# Patient Record
Sex: Female | Born: 1935 | Race: Black or African American | Hispanic: No | Smoking: Never smoker
Health system: Southern US, Community
[De-identification: ages and names within clinical notes are randomized; demographics above are authoritative.]

## PROBLEM LIST (undated history)

## (undated) DIAGNOSIS — F0390 Unspecified dementia without behavioral disturbance: Secondary | ICD-10-CM

## (undated) DIAGNOSIS — F0280 Dementia in other diseases classified elsewhere without behavioral disturbance: Secondary | ICD-10-CM

## (undated) DIAGNOSIS — G309 Alzheimer's disease, unspecified: Secondary | ICD-10-CM

## (undated) DIAGNOSIS — I10 Essential (primary) hypertension: Secondary | ICD-10-CM

## (undated) DIAGNOSIS — N289 Disorder of kidney and ureter, unspecified: Secondary | ICD-10-CM

---

## 1999-08-29 ENCOUNTER — Emergency Department (HOSPITAL_COMMUNITY): Admission: EM | Admit: 1999-08-29 | Discharge: 1999-08-29 | Payer: Self-pay | Admitting: Emergency Medicine

## 2000-12-25 ENCOUNTER — Other Ambulatory Visit: Admission: RE | Admit: 2000-12-25 | Discharge: 2000-12-25 | Payer: Self-pay | Admitting: Family Medicine

## 2000-12-28 ENCOUNTER — Encounter: Admission: RE | Admit: 2000-12-28 | Discharge: 2000-12-28 | Payer: Self-pay | Admitting: Family Medicine

## 2000-12-28 ENCOUNTER — Encounter: Payer: Self-pay | Admitting: Family Medicine

## 2001-03-23 ENCOUNTER — Encounter: Payer: Self-pay | Admitting: Obstetrics and Gynecology

## 2001-03-28 ENCOUNTER — Encounter (INDEPENDENT_AMBULATORY_CARE_PROVIDER_SITE_OTHER): Payer: Self-pay

## 2001-03-28 ENCOUNTER — Ambulatory Visit (HOSPITAL_COMMUNITY): Admission: RE | Admit: 2001-03-28 | Discharge: 2001-03-28 | Payer: Self-pay | Admitting: Obstetrics and Gynecology

## 2002-12-13 ENCOUNTER — Encounter: Admission: RE | Admit: 2002-12-13 | Discharge: 2002-12-13 | Payer: Self-pay | Admitting: Family Medicine

## 2002-12-13 ENCOUNTER — Encounter: Payer: Self-pay | Admitting: Family Medicine

## 2003-07-31 ENCOUNTER — Other Ambulatory Visit: Admission: RE | Admit: 2003-07-31 | Discharge: 2003-07-31 | Payer: Self-pay | Admitting: Obstetrics and Gynecology

## 2004-02-11 ENCOUNTER — Ambulatory Visit (HOSPITAL_COMMUNITY): Admission: RE | Admit: 2004-02-11 | Discharge: 2004-02-11 | Payer: Self-pay | Admitting: *Deleted

## 2004-05-25 ENCOUNTER — Encounter: Admission: RE | Admit: 2004-05-25 | Discharge: 2004-05-25 | Payer: Self-pay | Admitting: Family Medicine

## 2004-08-06 ENCOUNTER — Other Ambulatory Visit: Admission: RE | Admit: 2004-08-06 | Discharge: 2004-08-06 | Payer: Self-pay | Admitting: Obstetrics and Gynecology

## 2005-07-22 ENCOUNTER — Encounter: Admission: RE | Admit: 2005-07-22 | Discharge: 2005-07-22 | Payer: Self-pay | Admitting: Family Medicine

## 2005-08-11 ENCOUNTER — Encounter: Admission: RE | Admit: 2005-08-11 | Discharge: 2005-08-11 | Payer: Self-pay | Admitting: Nephrology

## 2006-01-17 ENCOUNTER — Ambulatory Visit: Payer: Self-pay

## 2006-05-25 ENCOUNTER — Inpatient Hospital Stay (HOSPITAL_COMMUNITY): Admission: RE | Admit: 2006-05-25 | Discharge: 2006-05-30 | Payer: Self-pay | Admitting: Orthopaedic Surgery

## 2006-08-03 ENCOUNTER — Encounter (HOSPITAL_COMMUNITY): Admission: RE | Admit: 2006-08-03 | Discharge: 2006-09-01 | Payer: Self-pay | Admitting: Nephrology

## 2006-09-08 ENCOUNTER — Encounter (HOSPITAL_COMMUNITY): Admission: RE | Admit: 2006-09-08 | Discharge: 2006-12-07 | Payer: Self-pay | Admitting: Nephrology

## 2007-02-03 ENCOUNTER — Emergency Department (HOSPITAL_COMMUNITY): Admission: EM | Admit: 2007-02-03 | Discharge: 2007-02-04 | Payer: Self-pay | Admitting: Emergency Medicine

## 2007-02-11 ENCOUNTER — Ambulatory Visit: Payer: Self-pay | Admitting: Critical Care Medicine

## 2007-02-11 ENCOUNTER — Inpatient Hospital Stay (HOSPITAL_COMMUNITY): Admission: EM | Admit: 2007-02-11 | Discharge: 2007-02-20 | Payer: Self-pay | Admitting: *Deleted

## 2007-02-13 ENCOUNTER — Encounter (INDEPENDENT_AMBULATORY_CARE_PROVIDER_SITE_OTHER): Payer: Self-pay | Admitting: Cardiology

## 2007-02-14 ENCOUNTER — Ambulatory Visit: Payer: Self-pay | Admitting: Vascular Surgery

## 2007-02-14 ENCOUNTER — Encounter (INDEPENDENT_AMBULATORY_CARE_PROVIDER_SITE_OTHER): Payer: Self-pay | Admitting: Specialist

## 2007-03-27 ENCOUNTER — Encounter: Admission: RE | Admit: 2007-03-27 | Discharge: 2007-03-27 | Payer: Self-pay | Admitting: Family Medicine

## 2007-04-03 ENCOUNTER — Ambulatory Visit (HOSPITAL_COMMUNITY): Admission: RE | Admit: 2007-04-03 | Discharge: 2007-04-03 | Payer: Self-pay | Admitting: Nephrology

## 2007-04-09 ENCOUNTER — Encounter: Admission: RE | Admit: 2007-04-09 | Discharge: 2007-04-09 | Payer: Self-pay | Admitting: Family Medicine

## 2007-05-01 ENCOUNTER — Ambulatory Visit (HOSPITAL_COMMUNITY): Admission: RE | Admit: 2007-05-01 | Discharge: 2007-05-01 | Payer: Self-pay | Admitting: Nephrology

## 2007-05-15 ENCOUNTER — Encounter (HOSPITAL_COMMUNITY): Admission: RE | Admit: 2007-05-15 | Discharge: 2007-08-13 | Payer: Self-pay | Admitting: Nephrology

## 2007-05-25 ENCOUNTER — Encounter: Admission: RE | Admit: 2007-05-25 | Discharge: 2007-05-25 | Payer: Self-pay | Admitting: Family Medicine

## 2007-06-27 ENCOUNTER — Encounter (INDEPENDENT_AMBULATORY_CARE_PROVIDER_SITE_OTHER): Payer: Self-pay | Admitting: Cardiology

## 2007-06-27 ENCOUNTER — Ambulatory Visit (HOSPITAL_COMMUNITY): Admission: RE | Admit: 2007-06-27 | Discharge: 2007-06-27 | Payer: Self-pay | Admitting: Cardiology

## 2007-06-27 ENCOUNTER — Ambulatory Visit: Payer: Self-pay | Admitting: Vascular Surgery

## 2007-08-21 ENCOUNTER — Encounter (HOSPITAL_COMMUNITY): Admission: RE | Admit: 2007-08-21 | Discharge: 2007-11-19 | Payer: Self-pay | Admitting: Nephrology

## 2007-12-11 ENCOUNTER — Encounter (HOSPITAL_COMMUNITY): Admission: RE | Admit: 2007-12-11 | Discharge: 2008-03-10 | Payer: Self-pay | Admitting: Nephrology

## 2008-07-04 ENCOUNTER — Encounter (HOSPITAL_COMMUNITY): Admission: RE | Admit: 2008-07-04 | Discharge: 2008-08-20 | Payer: Self-pay | Admitting: Nephrology

## 2009-07-31 ENCOUNTER — Inpatient Hospital Stay (HOSPITAL_COMMUNITY): Admission: EM | Admit: 2009-07-31 | Discharge: 2009-08-03 | Payer: Self-pay | Admitting: Emergency Medicine

## 2011-03-26 LAB — BASIC METABOLIC PANEL
CO2: 28 mEq/L (ref 19–32)
Chloride: 105 mEq/L (ref 96–112)
Creatinine, Ser: 1.69 mg/dL — ABNORMAL HIGH (ref 0.4–1.2)
GFR calc Af Amer: 35 mL/min — ABNORMAL LOW (ref >60)
Glucose, Bld: 109 mg/dL — ABNORMAL HIGH (ref 70–99)
Potassium: 4.2 mEq/L (ref 3.5–5.1)

## 2011-03-26 LAB — GLUCOSE, CAPILLARY: Glucose-Capillary: 113 mg/dL — ABNORMAL HIGH (ref 70–99)

## 2011-03-27 LAB — COMPREHENSIVE METABOLIC PANEL
ALT: 12 U/L (ref 0–35)
ALT: 16 U/L (ref 0–35)
AST: 30 U/L (ref 0–37)
AST: 34 U/L (ref 0–37)
Albumin: 2.9 g/dL — ABNORMAL LOW (ref 3.5–5.2)
Albumin: 3.2 g/dL — ABNORMAL LOW (ref 3.5–5.2)
Alkaline Phosphatase: 51 U/L (ref 39–117)
Alkaline Phosphatase: 52 U/L (ref 39–117)
CO2: 30 mEq/L (ref 19–32)
Chloride: 103 mEq/L (ref 96–112)
Creatinine, Ser: 1.8 mg/dL — ABNORMAL HIGH (ref 0.4–1.2)
GFR calc Af Amer: 29 mL/min — ABNORMAL LOW (ref >60)
GFR calc Af Amer: 32 mL/min — ABNORMAL LOW (ref >60)
GFR calc non Af Amer: 27 mL/min — ABNORMAL LOW (ref >60)
Glucose, Bld: 147 mg/dL — ABNORMAL HIGH (ref 70–99)
Potassium: 3 mEq/L — ABNORMAL LOW (ref 3.5–5.1)
Potassium: 3.1 mEq/L — ABNORMAL LOW (ref 3.5–5.1)
Sodium: 137 mEq/L (ref 135–145)
Sodium: 142 mEq/L (ref 135–145)
Total Bilirubin: 0.9 mg/dL (ref 0.3–1.2)
Total Protein: 5.6 g/dL — ABNORMAL LOW (ref 6.0–8.3)

## 2011-03-27 LAB — CBC
HCT: 20.2 % — ABNORMAL LOW (ref 36.0–46.0)
HCT: 22 % — ABNORMAL LOW (ref 36.0–46.0)
Hemoglobin: 6.7 g/dL — CL (ref 12.0–15.0)
Hemoglobin: 7.6 g/dL — CL (ref 12.0–15.0)
Hemoglobin: 9.1 g/dL — ABNORMAL LOW (ref 12.0–15.0)
MCHC: 33.2 g/dL (ref 30.0–36.0)
MCHC: 34.7 g/dL (ref 30.0–36.0)
MCV: 88.2 fL (ref 78.0–100.0)
MCV: 92.4 fL (ref 78.0–100.0)
Platelets: 119 10*3/uL — ABNORMAL LOW (ref 150–400)
Platelets: 141 10*3/uL — ABNORMAL LOW (ref 150–400)
RBC: 2.57 MIL/uL — ABNORMAL LOW (ref 3.87–5.11)
RBC: 2.85 MIL/uL — ABNORMAL LOW (ref 3.87–5.11)
RDW: 13.9 % (ref 11.5–15.5)
RDW: 14.5 % (ref 11.5–15.5)
WBC: 10.4 10*3/uL (ref 4.0–10.5)
WBC: 9.3 10*3/uL (ref 4.0–10.5)

## 2011-03-27 LAB — PREPARE FRESH FROZEN PLASMA

## 2011-03-27 LAB — DIFFERENTIAL
Basophils Absolute: 0 10*3/uL (ref 0.0–0.1)
Eosinophils Absolute: 0 10*3/uL (ref 0.0–0.7)
Eosinophils Relative: 0 % (ref 0–5)
Monocytes Absolute: 0.6 10*3/uL (ref 0.1–1.0)

## 2011-03-27 LAB — BASIC METABOLIC PANEL
BUN: 43 mg/dL — ABNORMAL HIGH (ref 6–23)
Chloride: 106 mEq/L (ref 96–112)
GFR calc non Af Amer: 27 mL/min — ABNORMAL LOW (ref >60)
Glucose, Bld: 111 mg/dL — ABNORMAL HIGH (ref 70–99)
Potassium: 4.4 mEq/L (ref 3.5–5.1)
Sodium: 140 mEq/L (ref 135–145)

## 2011-03-27 LAB — URINALYSIS, ROUTINE W REFLEX MICROSCOPIC
Glucose, UA: NEGATIVE mg/dL
Ketones, ur: NEGATIVE mg/dL
Leukocytes, UA: NEGATIVE
Nitrite: NEGATIVE
Protein, ur: NEGATIVE mg/dL
Specific Gravity, Urine: 1.009 (ref 1.005–1.030)
Specific Gravity, Urine: 1.012 (ref 1.005–1.030)
Urobilinogen, UA: 0.2 mg/dL (ref 0.0–1.0)
pH: 6 (ref 5.0–8.0)

## 2011-03-27 LAB — CROSSMATCH: ABO/RH(D): O POS

## 2011-03-27 LAB — APTT
aPTT: 29 seconds (ref 24–37)
aPTT: 35 seconds (ref 24–37)

## 2011-03-27 LAB — PROTIME-INR
INR: 1.5 (ref 0.00–1.49)
INR: 1.7 — ABNORMAL HIGH (ref 0.00–1.49)
Prothrombin Time: 19.4 seconds — ABNORMAL HIGH (ref 11.6–15.2)
Prothrombin Time: >90 seconds — ABNORMAL HIGH (ref 11.6–15.2)

## 2011-03-27 LAB — POCT CARDIAC MARKERS
CKMB, poc: <1 ng/mL — ABNORMAL LOW (ref 1.0–8.0)
Troponin i, poc: <0.05 ng/mL (ref 0.00–0.09)

## 2011-03-27 LAB — GLUCOSE, CAPILLARY: Glucose-Capillary: 115 mg/dL — ABNORMAL HIGH (ref 70–99)

## 2011-03-27 LAB — HEMOGLOBIN AND HEMATOCRIT, BLOOD: Hemoglobin: 9.5 g/dL — ABNORMAL LOW (ref 12.0–15.0)

## 2011-05-03 NOTE — Discharge Summary (Signed)
[name], [name]                 ACCOUNT NO.:  [id number]   MEDICAL RECORD NO.:  [id number]          PATIENT TYPE:  INP   LOCATION:  1423                         FACILITY:  [facility]   PHYSICIAN:  Corinna L. Lendell Caprice, MDDATE OF BIRTH:  [birth year]   DATE OF ADMISSION:  07/30/2009  DATE OF DISCHARGE:  08/03/2009                               DISCHARGE SUMMARY   DISCHARGE DIAGNOSES:  1. Coumadin poisoning  2. Mouth bleeding and possible gastrointestinal bleeding secondary to      Coumadin poisoning, resolved.  3. History of pulmonary embolus in 2008.  4. Hypertension.  5. Constipation.  6. Renal insufficiency.  7. Acute blood loss anemia.  8. Hypokalemia.   DISCHARGE MEDICATIONS:  Stop Coumadin (The patient was not supposed to  be on this, and we have thrown away her bottles.)  No aspirin, ibuprofen  or Naprosyn for 2 weeks.  Prilosec 20 mg daily for 2 weeks (empiric).  K-  Dur 10 mEq daily, Lasix 40 mg a day, amlodipine 10 mg a day, metoprolol  12.5 twice a day, laxative of choice as needed.   CONDITION:  Stable.   DIET:  Low salt.   FOLLOW-UP:  With Dr. Arvilla Market in a week.   ACTIVITY:  Continue walker.   CONSULTATIONS:  None.   PROCEDURES:  None.   LABS:  Initial hemoglobin was 3.5, which was repeated and confirmed. At  discharge her hemoglobin remained stable at 9.8, hematocrit on admission  11.  At discharge he had hematocrit 28, MCV 88.  Initial white blood  cell count was 12.8, but this normalized the same day. Platelet count on  admission 141. INR greater than 9.8.  Pro time greater than 90, PTT 86.  On August 15 her INR was 1.3.  Pro time 16, PTT 29.  Sodium on admission  137, potassium 3, chloride 100, bicarbonate 23, glucose 147, BUN 100,  creatinine 1.98.  Liver function tests significant for an albumin of  2.9. At discharge her potassium was 4.2, BUN 35, glucose 109, creatinine  1.69. Point of care enzymes negative. On admission her urinalysis was  contaminated  with many squamous epithelial cells.  Repeat urinalysis  showed trace blood, negative nitrite, negative leukocyte esterase.   SPECIAL STUDIES:  Radiology: Chest x-ray on admission showed nothing  acute.  No change in chronic elevation of left hemidiaphragm.  EKG  showed normal sinus rhythm with first-degree AV block, left ventricular  hypertrophy and repolarization abnormality.   HISTORY AND HOSPITAL COURSE:  [name] is a pleasant 77 year old white  female who presented with bleeding from the mouth, tarry stools and  bright red bloody stools.  She was found to have a hemoglobin of 3.1.  An H and P reports a blood pressure of 148/68, heart rate 83.  She had  dried blood in her mouth.  The patient was given transfusion of packed  red blood cells, FFP. She was also given vitamin K.  She denied being on  Coumadin, but there were 3 or 4 pill bottles that were old and had  Coumadin.  I suspect she  was inadvertently taking these.  After reversal  of her coagulopathy, she had no further bleeding.  Her blood pressure  remained stable.  She had no further melanotic stools.  In fact, she has  been constipated here.  Initially, there was concern about urinary tract  infection, but this sample was contaminated and upon repeat was  negative.  The patient is tolerating a regular diet.  She is ambulating  with a walker.  Her hemoglobins have been stable. Her renal  insufficiency has improved, and she will get a Dulcolax suppository and  then go home later on this afternoon. Total time on the day of discharge  is 50 minutes.      Corinna L. Lendell Caprice, MD  Electronically Signed     CLS/MEDQ  D:  08/03/2009  T:  08/03/2009  Job:  [id number]   cc:   Donia Guiles, M.D.  Fax: [phone]

## 2011-05-03 NOTE — H&P (Signed)
[name], [name]                 ACCOUNT NO.:  [id number]   MEDICAL RECORD NO.:  [id number]          PATIENT TYPE:  EMS   LOCATION:  ED                           FACILITY:  [facility]   PHYSICIAN:  Beckey Rutter, MD  DATE OF BIRTH:  08/14/1919   DATE OF ADMISSION:  07/30/2009  DATE OF DISCHARGE:                              HISTORY & PHYSICAL   PRIMARY CARE PHYSICIAN:  Dr. Donia Guiles.   CHIEF COMPLAINT:  Rectal bleeding.   HISTORY OF PRESENT ILLNESS:  [name] is an 77 year old pleasant  African American female with past medical history significant for  pulmonary embolism, pneumonia, history of congestive heart failure with  diastolic dysfunction, chronic renal insufficiency, hypertension,  hyperlipidemia and degenerative joint disease who presented today by  ambulance to [facility] ER because of rectal bleeding.  The patient was  in her usual state of health when she noticed mouth bleeding.  The  patient was not coughing or nauseated, but nevertheless she continued to  spit significant amount of blood.  EMS was called and upon home  evaluation she was told that she had dental bleeding.  The patient then  stayed at home and she was noticed to have rectal bleeding with dark  tarry stool and bright red blood.  The EMS was called again and at this  time the patient brought to the hospital.  In the emergency department,  the patient was found with profound anemia as noted below.  The patient  has mild nausea, but she denied any significant vomiting.  She denied  abdominal pain, headache, fever, chills, severe lightheadedness or  syncope.  The patient does not have history of liver cirrhosis and she  is not taking blood thinner.  Although Coumadin was enlisted in her  medication, the family and the patient stated that she was taking  Coumadin previously because of the pulmonary embolism and the knee  surgery, but currently she is not taking Coumadin.   PAST MEDICAL  HISTORY:  1. Mild congestive heart failure with diastolic dysfunction.  2. Chronic renal insufficiency.  3. Hypertension.  4. Hyperlipidemia.  5. Degenerative joint disease.  6. History of pneumonia.  7. Acute pulmonary embolism in March 2008, with infarct and bloody      pleural effusion.   MEDICATION ALLERGIES:  NOT KNOWN TO HAVE MEDICATION ALLERGY.   MEDICATIONS:  1. Lasix 40 mg daily.  2. Metoprolol tartrate 12.5 twice a day.  3. Norvasc 10 mg one time a day.  4. Tenoretic 50 one tablet daily.  5. Coumadin was among the patient's medication, nevertheless the      patient denied taking the Coumadin for some time.  I guess the      medication will need to be reconciled with her primary physician      records.   FAMILY HISTORY:  The patient has a sister with diabetes and one brother  who was diagnosed with throat cancer.   SOCIAL HISTORY:  No ethanol abuse, tobacco abuse or recreational drugs.  Lives with the family.   REVIEW OF SYSTEMS:  A 14 point review of systems is noncontributory.   PHYSICAL EXAMINATION:  VITAL SIGNS:  Temperature is 98.1, blood pressure  is 148/64, pulse is 83, respiratory rate is 16.  HEAD:  Atraumatic,  normocephalic.  EYES:  PERRL with pale conjunctive.  MOUTH:  No obvious sign of bleeding or ulcer.  Pale mucosa.  NECK:  Supple.  No JVD.  PRECORDIUM:  First and second heart sounds audible.  No added sounds.  LUNGS:  Bilateral decreased air entry.  No adventitious sounds.  ABDOMEN:  Soft, nontender.  Bowel sounds sluggish, but audible.  EXTREMITIES:  No significant lower extremity edema.  NEUROLOGICAL:  She is alert and oriented x3, feeling weak.  SKIN:  The patient has evidence of dried blood around her neck.   LABORATORY DATA:  PTT is 86 with normal PT is more than 9.8.  Microscopic urine showing many bacteria.  White blood count is 3.6.  Urinalysis is cloudy with negative nitrate and moderate leukocyte  esterase.  White blood count is 12.8,  hemoglobin is 6.7, hematocrit is  20.2.  Sodium is 137, potassium 3.0, chloride 105, bicarb is 23, glucose  147, BUN is 100, creatinine is 1.9, lipase is 62, CK-MB is less than  1.0.  Troponin is less than 0.05, myoglobin is 47.  Chest x-ray portable  showing a stable a chest x-ray, no active lung disease.  No change in  chronic elevation of the left hemidiaphragm.   ASSESSMENT AND PLAN:  This is an 77 year old female with  gastrointestinal bleeding and Coumadin toxicity, although the patient is  not currently on Coumadin, but Coumadin is among her medication.  When  the patient was asked about her current medications, she provided  multiple medications, including Coumadin in more than one canister.  Her  PTT is elevated and PT elevated as well.  There is no history of liver  disease, although liver transaminases were not done currently.  As per  the ED physician, gastric feeding tube was not bloody to suction.   ASSESSMENT AND PLAN:  1. The patient will receive blood transfusion on a stat basis.  She      will also receive fresh frozen plasma and vitamin K.  I will obtain      stat transaminases and liver function tests.  We will obtain two      large IV access.  We will consider gastroenterology since the      patient has this significant rectal bleeding when the patient's      hemoglobin and hematocrit is stable.  2. For her congestive heart failure, we will restart Lasix after the      blood transfusion.  3. Renal insufficiency.  Her GFR has worsened.  Nevertheless, this      could be just prerenal with the baseline of chronic kidney disease.      We will continue to monitor for now.  4. Hypertension.  The patient was taking Norvasc and the diuretics,      but since the patient has acute blood loss anemia at this time I      will hold the blood pressure medication and reevaluate after      hemoglobin and hematocrit      are stable.  5. The patient will be started on intravenous  Protonix every 12 hours.  6. The patient will be admitted to a step-down bed.      Beckey Rutter, MD  Electronically Signed     EME/MEDQ  D:  07/31/2009  T:  07/31/2009  Job:  [id number]   cc:   Donia Guiles, M.D.  Fax: [phone]

## 2011-05-06 NOTE — H&P (Signed)
[facility]  Patient:    [name], [name] Visit Number: [id number] MRN: [id number]          Service Type: Attending:  Beather Arbour. Thomasena Edis, M.D. Adm. Date:  03/28/01   CC:         [facility] for surgery 03/28/01  Desma Maxim, M.D., [facility] at [facility]   History and Physical  DATE OF BIRTH:                August 14, 1919, Social Security [id number].  HISTORY OF PRESENT ILLNESS:   The patient is an 77 year old, para 1, black female referred initially from Dr. Donia Guiles at [facility] at [facility] for complaints of enlarged uterus and vaginal bleeding.  The patient stated that she had had one episode of vaginal bleeding.  She subsequently underwent a pelvic ultrasound and was found to have uterine fibroids, one of which was 5.8 x 8.3 x 8.3 cm in the left intramural region of the fundus, and a second fibroid 3.3 x 3.9 x 2.8 cm.  The endometrial stripe thickness was noted to be thickened considering her postmenopausal state, measuring 11 mm. Dr. Arvilla Market attempted an endometrial aspiration but was unable to complete this due to her stenosed cervix.  I subsequently saw the patient and attempted endometrial biopsy but was unable to perform this as well.  The patient is subsequently admitted for a D&C hysteroscopy to rule out endometrial hyperplasia or endometrial cancer.  The surgery, including anesthetic complication, hemorrhage, infection, damage to adjacent structures including bladder, bowel, blood vessels, and ureters, was discussed with the patient. She was made aware of the risk of uterine perforation, which could result in overwhelming life threatening hemorrhage requiring hysterectomy, or uterine perforation, which could result in bowel damage resulting in emergent colostomy or overwhelming life threatening peritonitis.  She expressed understanding of and acceptance of these risks.  PAST  OBSTETRICAL AND GYNECOLOGIC HISTORY:  Menarche at age 52.  menopausal for many years.  No history of IUD use or DES in utero exposure.  PAST MEDICAL HISTORY:         Arthritis and hypertension.  MEDICATIONS:                  1. Norvasc 10 mg daily.                               2. Occasional Aleve for arthritis.  FAMILY HISTORY:               Two sisters with diabetes, one brother with throat cancer.  ALLERGIES:                    No known drug allergies.  REVIEW OF SYSTEMS:            Negative.  The patient denies headaches, visual symptoms, shortness of breath, chest pain, abdominal pain, change in bowel habits, unintentional weight loss, dysuria, urgency, frequency, vaginal pruritus or discharge or pain or bleeding with intercourse.  PHYSICAL EXAMINATION:  GENERAL:                      Well-developed, pleasant, black female.  VITAL SIGNS:                  Weight 160.  Blood pressure 150/70, heart rate 88.  HEENT:  Grossly normal.  NECK:                         Supple without thyromegaly.  LUNGS:                        Clear to auscultation.  CARDIAC:                      Regular rate and rhythm.  BREASTS:                      Without masses.  No dimpling or retraction.  ABDOMEN:                      Soft and nontender.  No hepatosplenomegaly.  PELVIC:                       Uterus mid position to slightly retroverted with no adnexal mass palpated.  There is a posterior fibroid palpated.  ASSESSMENT AND PLAN:          The patient is an 77 year old, para 1, black female with postmenopausal bleeding.  Unable to perform endometrial biopsy due to cervical stenosis.  The patient is admitted for dilatation and curettage hysteroscopy to rule out endometrial cancer as her endometrial stripe was found to be quite thickened at 11 mm.  In addition at her initial consult visit, she as found to have Trichomonas vaginitis for which I treated her with Flagyl 500  mg p.o. b.i.d. for 7 days.  A followup wet prep.  The risks have been explained to the patient.  She expresses understanding of and acceptance of these risks and desires to proceed with D&C hysteroscopy. Attending:  Beather Arbour. Thomasena Edis, M.D. DD:  03/28/01 TD:  03/28/01 Job: [id number]

## 2011-05-06 NOTE — Consult Note (Signed)
NAMEKELLAN, BOEHLKE                 ACCOUNT NO.:  [id number]   MEDICAL RECORD NO.:  [id number]          PATIENT TYPE:  INP   LOCATION:  3732                         FACILITY:  [facility]   PHYSICIAN:  Armanda Magic, M.D.     DATE OF BIRTH:  1918/05/15   DATE OF CONSULTATION:  02/12/2007  DATE OF DISCHARGE:                                 CONSULTATION   REFERRING PHYSICIAN:  Dr. Donia Guiles.   CHIEF COMPLAINT:  Chest pain and shortness of breath.   HISTORY OF PRESENT ILLNESS:  This is an 77 year old female who was  admitted yesterday after 24 hours of substernal chest pain and  increasing shortness of breath.  She came to the emergency room where  her pain was relieved with sublingual nitroglycerin.  She had been seen  in the office last week for workup of chest pain and had a 2-D  echocardiogram done which showed normal LV size and systolic function,  EF 70% with mild aortic stenosis and a small trivial pericardial  effusion and a large left pleural effusion.  Apparently over the weekend  became increasingly short of breath with chest pain somewhat increased  with deep respiration. The chest pain is nonradiating but does go away  with nitroglycerin.  She was noted to have an elevated BNP and a  probable left lower lobe pneumonia and is on Avelox.  We are now asked  to consult.   ALLERGIES:  None.   MEDICATIONS:  1. Norvasc 10 mg a day.  2. Baby aspirin daily.  3. Vitamin D daily.  4. Lovenox 30 mg daily.  5. Lasix 40 mg IV daily.  6. Prinivil 10 mg a day.  7. Avelox 400 mg IV daily.  8. Protonix 40 mg a day.  9. Zocor 20 mg a day.   SOCIAL HISTORY:  She denies any tobacco or alcohol use, no IV drugs.   FAMILY HISTORY:  She has two sisters with diabetes, one brother with  throat CA.   PAST MEDICAL HISTORY:  Hypertension, dyslipidemia, DJD and renal  insufficiency.   REVIEW OF SYSTEMS:  Other that what is stated in the HPI includes recent  bout of diarrhea over the past  24 hours.   PHYSICAL EXAM:  VITAL SIGNS:  Blood pressure is 119/50, pulse 83,  respirations 20.  She is afebrile.  O2 saturations 96% on room air.  GENERAL:  Well-developed, well-nourished female in no acute distress.  HEENT:  Benign.  NECK:  Supple without lymphadenopathy.  Carotids upstrokes +2  bilaterally with no bruits.  LUNGS:  Clear to auscultation anteriorly.  HEART:  Regular rate and rhythm.  No murmurs, rubs or gallops. Normal  S1, S2.  ABDOMEN:  Soft.  EXTREMITIES:  No edema.  NEUROLOGIC:  She is alert and oriented x3.   LABS:  Sodium 136, potassium 4.8, chloride 106, bicarb 24, BUN 36,  creatinine 2.6, glucose 130.  White cell count 9.9, hemoglobin 9.5,  hematocrit 28, platelet count 342.  LFTs are normal. CPKs are 46, 42,  37. MB 0.7, 0.6, 0.8. Troponin 0.06, 0.07 and 0.08.  BNP 552, LDL 61,  HDL 29.  Chest x-ray shows left lower lobe consolidation with possible  component of left pleural fluid consistent with left lower lobe  pneumonia.  EKG shows sinus rhythm at 78 beats per minute with  nonspecific ST changes. 2-D echocardiogram showed normal LV systolic  function, EF 70% with mild aortic stenosis, mild mitral and tricuspid  regurgitation and very small trivial pericardial effusion and large left  pleural effusion.   ASSESSMENT:  1. Atypical chest pain syndrome but the chest pain is relieved with      nitroglycerin.  Electrocardiogram is essentially nonischemic,      cardiac enzymes are negative x3 except for a slightly elevated      troponin which I think is probably related to underlying renal      insufficiency as well as congestive heart failure.  2. Hypertension well controlled.  3. Dyslipidemia.  LDL is excellent.  HDL is low.  4. Left lower lobe pneumonia on Avelox.  5. Mild congestive heart failure which is probably related to      diastolic dysfunction exacerbated by hypoxia from her pneumonia.   PLAN:  1. Continue IV diuresis, continue Avelox,  questionable thoracentesis      of left pleural effusion if large enough. On 2-D echocardiogram had      been seen as large. I will leave this to the primary service.  2. Adenosine Cardiolite once her pneumonia has resolved or at least      symptomatically improved.  Will follow with you. Check a D-dimer.      Will continue to follow with you.      Armanda Magic, M.D.  Electronically Signed     TT/MEDQ  D:  02/12/2007  T:  02/12/2007  Job:  [id number]   cc:   Donia Guiles, M.D.  Fax: [phone]

## 2011-05-06 NOTE — Op Note (Signed)
[facility]  Patient:    [name], [name]                        MRN: [id number] Adm. Date:  [id number] Attending:  Madelyn Flavors CC:         [facility], [address]  Desma Maxim, M.D., [facility] [address]   Operative Report  PREOPERATIVE DIAGNOSIS:       Postmenopausal bleeding with thickened endometrial stripe at 11 mm, stenosed cervix.  Unable to perform endometrial biopsy.  POSTOPERATIVE DIAGNOSIS:      Postmenopausal bleeding with thickened endometrial stripe at 11 mm, stenosed cervix.  Unable to perform endometrial biopsy.  Endometrial polyps.  PROCEDURE:                    Dilatation and curettage, hysteroscopy.  SURGEON:                      Beather Arbour. Thomasena Edis, M.D., Ph.D.  Bronwen Betters BLOOD LOSS:         Minimal.  ANESTHESIA:                   Monitored anesthesia care plus 10 cc of 1% lidocaine paracervical block.  DRAINS:                       None.  COMPLICATIONS:                None.  DESCRIPTION OF PROCEDURE:     The patient was brought to the operating room and identified on the operating room table.  After the patient was adequately sedated with MAC analgesia, she was placed in the dorsolithotomy position and prepped and draped in the usual sterile fashion.  Examination under anesthesia revealed there to be a large posterior fibroid which could be felt with the patient relaxed and sedated.  The uterus was anteverted.  The anterior lip of the cervix was infiltrated with 1 cc of 1% lidocaine and grasped with a single-tooth tenaculum.  It should be noted that the patient was straight catheterized for approximately 150 cc of clear yellow urine prior to the beginning of the procedure.  The cervix was noted to be quite stenosed, and I began to dilate with very small dilators.  This was done very carefully and gently to decrease the risk of developing a false passage; however, the small dilator fell  right into the internal os.  The cervix was then very carefully and gently dilated up to a #25 Pratt dilator.  An ACMI hysteroscope was placed and using sorbitol as an extending medium, a careful and thorough hysteroscopic examination was performed.  There were noted to be several endometrial polyps.  Upon visualization at the fundus of the uterus, there was noted to be normal thin atrophic endometrium.  The scope was then withdrawn, and the endometrium was curetted in a systematic clockwise fashion with the polyps obtained and sent to pathology for examination.  A good ______.  The scope was then replaced after the Randall stone forceps were placed with additional tissue obtained.  The hysteroscope was again replaced, and the polyps were noted to have been removed in their entirety.  There was noted to be no bleeding.  At that point, the hysteroscope was withdrawn.  The tenaculum was removed, and there was noted to be no bleeding at the tenaculum  site.  The procedure was then terminated.  The patient did receive 10 cc of 1% lidocaine paracervical block.  The patient tolerated the procedure well without apparent complications and was transferred to the recovery room in stable condition after all instruments, sponge, needle counts were correct. DD:  03/28/01 TD:  03/28/01 Job: [id number]

## 2011-05-06 NOTE — Op Note (Signed)
[facility]  Patient:    [name], [name]                        MRN: [id number] Proc. Date: 03/28/01 Adm. Date:  [id number] Attending:  Madelyn Flavors                           Operative Report  ADDENDUM:  ESTIMATED BLOOD LOSS:           Minimal.  FLUIDS:                         900 cc crystalloid. DD:  03/28/01 TD:  03/28/01 Job: [id number]

## 2011-05-06 NOTE — Discharge Summary (Signed)
NAMECHEMEKA, FILICE                 ACCOUNT NO.:  [id number]   MEDICAL RECORD NO.:  [id number]          PATIENT TYPE:  INP   LOCATION:  3732                         FACILITY:  [facility]   PHYSICIAN:  Kela Millin, M.D.DATE OF BIRTH:  1918/11/27   DATE OF ADMISSION:  02/11/2007  DATE OF DISCHARGE:  02/20/2007                               DISCHARGE SUMMARY   DISCHARGE DIAGNOSES:  1. Acute pulmonary embolus with infarct and bloody pleural effusion.  2. Pneumonia.  3. Mild congestive heart failure.  Per cardiology, probably related to      diastolic dysfunction exacerbated by hypoxia from pneumonia or      pulmonary embolus.  4. Chronic renal insufficiency.  5. Hypertension.  6. History of dyslipidemia.  7. History of degenerative joint disease.   PROCEDURES:  1. VQ scan:  Findings consistent with intermediate probability for      pulmonary embolus.  2. Airspace disease on the chest x-ray, large effusion and large      ventilation defect on the ventilation and perfusion portion of the      exam.  3. Ultrasound-guided thoracentesis: 570 mL of blood-tinged fluid.  4. 2-D echocardiogram: Overall left ventricular systolic function      normal, EF estimated to be 60%.  No left ventricular regional wall      motion abnormalities.  Moderate focal basal septal hypertrophy      noted.  Please see electronic chart for details for full report.   CONSULTATIONS:  1. Charlcie Cradle Delford Field, MD, FCCP, pulmonary  2. Armanda Magic, M.D., cardiology.   HISTORY:  The patient is an 77 year old black female with past medical  history significant for above-listed medical problems who presented with  complaints of chest pain and wheezing shortness of breath.  It was noted  that the patient had been evaluated the week prior to admission in the  ER with complaints of chest pain as well.  At that time, she was seen by  cardiology.  An echocardiogram done showed an ejection fraction of about  70%.  The  patient was scheduled to get a stress test done per Dr. Armanda Magic on February 29.  Her shortness of breath was increasing, and she  also had chest pain described as sharp, left sided, 6 to 8/10 in  intensity associated with some nausea.  The patient had no vomiting and  denied diaphoresis.  In the ER, she had a chest x-ray which was  consistent with pneumonia and a pleural effusion.  She was admitted to  the [facility] service for further evaluation and management.   Her physical exam upon admission per Dr.  Arthor Captain revealed a temperature  of 101 with a pulse of 82, respiratory rate of 20, blood pressure  123/62.  The pertinent findings on exam on cardiovascular exam, murmur  was reported to be present.  Regular rhythm.  Normal S1, S2.  On her  lung exam, there were decreased breath sounds, especially on the left  side, and left-sided dullness to percussion noted.  Rhonchi present  bilaterally, especially on the left side.  No wheezes.  On her  extremities, no edema. The rest of her physical exam was reported to be  within normal limits.   LABORATORY DATA:  White cell count 9.9, hemoglobin 9.5, hematocrit 28,  MCV 86.7, platelet count 342.  Sodium 136, potassium 4.8, chloride 106,  bicarb 24, glucose 130, BUN 34, creatinine 2.7, alkaline phosphatase  228, AST 42, ALT 28.   ECG showed normal sinus rhythm, rate 78, nonspecific changes.   Chest x-ray report as stated above.   HOSPITAL COURSE:  #1.  PNEUMONIA, LEFT LOWER LOBE:  Upon admission, the patient was  empirically started on IV antibiotics and also placed on nasal cannula  O2.  Blood cultures were obtained, and this did not grow any bacteria.  The patient defervesced, and her shortness of breath improved.  She also  did not have any leukocytosis.  She will be discharged home at this time  on oral antibiotics to complete a treatment course, and she is to follow  up with her primary care physician.   #2.  ACUTE  PULMONARY EMBOLUS WITH INFARCTION AND BLOODY EFFUSION:  Following admission, the patient had a D-dimer done, and it was noted to  be markedly elevated.  A VQ scan was ordered, and the results are as  stated above.  The patient was started on anticoagulation with IV  heparin, and pulmonology was consulted for further recommendations, and  Dr. Delford Field saw the patient.  A thoracentesis was ordered, and this was  done by interventional radiology.  As noted above, blood-tinged fluid  was removed, and the smear showed no organisms, rare wbc's present, and  there was no growth on the cultures.  It was cloudy with white cell  count of 400, segmented neutrophils 68, lymphocytes 20, and the fluid  glucose 93, amylase 230, fluid LDH 257, and pH 8.0.  Pulmonology  followed the patient and stated that the findings were consistent with  acute pulmonary embolus with infarct and a bloody pleural effusion.  The  patient was changed to Lovenox and subsequently started on Coumadin.  Her PT/INR was monitored.  Once INR became therapeutic, it was  overlapped with Coumadin for 2 days.  The patient has not had any gross  evidence of bleeding, and the hemoglobin and hematocrit have remained  stable.  The patient is to be on Coumadin for 6 months per pulmonology  recommendations. He is to follow up with is primary care physician for  the PT/INR monitoring.  The patient's INR today upon discharge is 2.5,  and she is discharged on Coumadin 5 mg daily.  She is to go to her  primary care physician's office for PT/INR on March 6, and the Coumadin  dose is to be adjusted as appropriate.   #3.  CHRONIC RENAL INSUFFICIENCY:  The patient's creatinine upon  admission was noted to be 2.7, and her last creatinine today prior to  discharge is 2.2.  The patient has indicated that she is followed at the  [facility], and she is to follow up with them as scheduled.  #4.  CONGESTIVE HEART FAILURE, MILD: The patient was  maintained on Lasix  during her hospital stay.  It was noted that cardiology was initially  consulted because of patient's atypical chest pain.  Following the  diagnosis of pulmonary embolus, this pain was attributed to the PE.  The  patient was noted to have an elevated brain-natruretic peptide as well,  up to 937, and a 2-D echocardiogram was  done with the results as stated  above.  The impression was that this was precipitated by the hypoxia  secondary to her pneumonia as well as the pulmonary embolus.  The  patient was diuresed.  She does not have any edema, and her shortness of  breath has resolved.  She has been oxygenating well on room air.  She is  to continue her ACE inhibitor, Lasix, as well as aspirin 81 mg, and  antilipid agent upon discharge.   #5.  HYPERTENSION:  The patient was maintained on Norvasc ACE inhibitor  during her hospital stay, and her blood pressures were monitored.   #6.  NORMOCYTIC ANEMIA, LIKELY OF CHRONIC DISEASE:  Her hemoglobin and  hematocrit were monitored during her hospital stay while being  anticoagulated, and they remained stable without any gross evidence of  bleeding.  Her hemoglobin upon discharge was 8.9 with hematocrit of  25.5.   DISCHARGE MEDICATIONS:  1. Avelox 400 mg p.o. daily x2 more days.  2. Coumadin 5 mg p.o. daily.  The PT/INR is to be checked as described      above and the dose adjusted as appropriate per primary care      physician.  3. Patient to continue her pre-admission medications as instructed.   FOLLOWUP:  1. Donia Guiles, M.D., in one week.  Patient to call for      appointment.  2. The patient is also to have her PT/INR done at her primary care      physician's office or the results called to primary care physician.  3. Armanda Magic, M.D.  Patient to call for appointment.  4.  [facility].  Patient to call for followup appointment.   CONDITION ON DISCHARGE:  Improved and stable.      Kela Millin,  M.D.  Electronically Signed     ACV/MEDQ  D:  02/20/2007  T:  02/20/2007  Job:  [id number]   cc:   Donia Guiles, M.D.  [facility]  Armanda Magic, M.D.

## 2011-05-06 NOTE — Consult Note (Signed)
[name], [name]                 ACCOUNT NO.:  [id number]   MEDICAL RECORD NO.:  [id number]          PATIENT TYPE:  INP   LOCATION:  3732                         FACILITY:  [facility]   PHYSICIAN:  Charlcie Cradle. Delford Field, MD, FCCPDATE OF BIRTH:  03/14/[birth year]   DATE OF CONSULTATION:  02/13/2007  DATE OF DISCHARGE:                                 CONSULTATION   PULMONARY CONSULTATION:   CHIEF COMPLAINT:  Evaluate for chest pain, abnormal chest x-ray.   HISTORY OF PRESENT ILLNESS:  This is an 77 year old African-American  female who presented on February 24 with left-sided chest pain, worse  with inspiration, increased shortness of breath that has been coming on  for about three days, symptoms worsened to the point where she presented  to the emergency room.  She has had no previous history of blood clots  or pneumonia or primary lung problems.  At baseline, she is able to do  routine activities of daily living and light exercise.  She lives at  home with her son.  She was able to walk short distances in the past,  but now is markedly worse.  She has noted increasing lower extremity  swelling for about a month-and-a-half, left greater than right lower  extremity, these symptoms also became worse as well.  She denies any  fever, chills or sweats, occasional minimal cough productive of thin  yellow mucus, slightly worse than usual, no nausea or vomiting, does  have loose stools, no other abnormal exposures.   PAST MEDICAL HISTORY:  1. Hypertension.  2. Hyperlipidemia.  3. Degenerative joint disease.  4. Renal insufficiency.   SOCIAL HISTORY:  The patient is a lifelong never smoker, lives with a  son who helps with upkeep, retired from [employer].   FAMILY HISTORY:  Positive for diabetes in a sister, throat cancer in a  brother.   ALLERGIES:  None.   CURRENT MEDICATIONS:  Protonix, aspirin, Norvasc, Zocor, lisinopril,  Lasix, vitamin D, Lovenox, Avelox.   REVIEW OF SYSTEMS:   Noncontributory.   PHYSICAL EXAMINATION:  GENERAL:  This is an elderly African-American  female in no distress.  VITAL SIGNS:  Temp 98, heart rate of 87, blood pressure 134/60,  respirations 20, saturation 95% on three liters.  HEENT:  Normocephalic.  No jugular venous distension.  PULMONARY:  There is decreased breath sounds at the left base, dullness  to percussion halfway up.  CARDIAC EXAM:  Showed resting irregular rhythm without S3.  Normal S1-  S2.  There is a 3/6 systolic ejection murmur.  EXTREMITIES:  The lower extremities are edematous, the left greater than  right.  There are 2+ pulses.  NEUROLOGIC:  Intact.   LABORATORY DATA:  Echo showed diastolic dysfunction, otherwise normal LV  function.   Sodium 136, potassium 4.6, creatinine 2.6, chloride 106, CO2 23, BUN 26,  hemoglobin 8.9, white count 10.7.   Chest x-ray shows left pleural effusion, left lower lobe consolidation,  __________  is noted.   Ventilation-perfusion scan shows mesh defect, intermediate probability.  D-dimer is greater than 20.   IMPRESSION:  Likely acute pulmonary embolism  with pulmonary infarct with  a left pleural effusion in the setting of positive D-dimer and abnormal  ventilation-perfusion scan.   RECOMMENDATIONS:  Switch Lovenox to heparin so that we can anticipate an  ultrasound-guided thoracentesis on February 14, 2007.  Tap the left  effusion dry.  Would follow up with venous Doppler of the lower  extremities, eventually will need Coumadin bridge.      Charlcie Cradle Delford Field, MD, [facility]  Electronically Signed     PEW/MEDQ  D:  02/13/2007  T:  02/14/2007  Job:  [id number]   cc:   Michelene Gardener, MD

## 2011-05-06 NOTE — H&P (Signed)
[name], [name]                 ACCOUNT NO.:  [id number]   MEDICAL RECORD NO.:  [id number]          PATIENT TYPE:  INP   LOCATION:  3732                         FACILITY:  [facility]   PHYSICIAN:  Michelene Gardener, MD    DATE OF BIRTH:  [birth year]   DATE OF ADMISSION:  02/11/2007  DATE OF DISCHARGE:                              HISTORY & PHYSICAL   PRIMARY CARE PHYSICIAN:  Donia Guiles, M.D.   CHIEF COMPLAINT:  Chest pain and increasing shortness of breath.   HISTORY OF PRESENT ILLNESS:  This is an 77 year old African American  female with past medical history of multiple medical problems presented  with the above-mentioned complaints.  This patient was evaluated last  week in the ER with chest pain.  At that time, she was seen by  cardiology and has had an echocardiogram that showed ejection fraction  more than 70%.  The patient was scheduled to get a stress test with Dr.  Armanda Magic on February 16, 2007.   She came in today because she has been having increasing chest pain  described as sharp in the left side of the chest with no radiation,  around 6-8/10.  It is associated with some nausea.  There is no vomiting  and there is increased shortness of breath but there is no sweating.   In the ER, her x-ray was showing a pneumonia with pleural effusion.  Hospitalist service was called for further evaluation.   PAST MEDICAL HISTORY:  1. Significant for hypertension.  2. Dyslipidemia.  3. Degenerative joint disease.  4. Renal insufficiency.   PAST SURGICAL HISTORY:  Denied.   ALLERGIES:  No known drug allergies.   MEDICATIONS:  1. Norvasc 10 mg p.o. once daily.  2. Aspirin 81 mg p.o. once daily.  3. Vitamin B one daily.  4. Lovenox 30 mg once daily.  5. Lasix 40 mg once daily.  6. Lisinopril 10 mg p.o. once daily.  7. Protonix 40 mg once daily.  8. Zocor 20 mg once daily.   SOCIAL HISTORY:  Denied smoking, denied alcohol drinking, denied  recreational drugs.   FAMILY HISTORY:  Has two sisters with diabetes.  One brother with throat  cancer.   REVIEW OF SYSTEMS:  CONSTITUTIONAL:  Showed fatigability.  Had fever.  EYES:  No blurred vision.  ENT:  No difficulty swallowing.  RESPIRATORY:  Positive for shortness of breath and cough and sputum.  CARDIOVASCULAR:  Positive for chest pain and shortness of breath. GI:  Positive for  nausea.  There is no vomiting and no diarrhea.  GU:  No dysuria, no  hematuria.  ENDOCRINE:  No polyuria and no nocturia.  HEMATOLOGY:  No  bruising or bleeding.  ID:  No rashes, no lesions. NEUROLOGICAL:  No  numbness or tingling.  The rest of the systems were reviewed and they  were negative.   PHYSICAL EXAMINATION:  VITAL SIGNS:  Temperature is 101, pulse 82,  respiratory rate 20, blood pressure 123/62.  GENERAL APPEARANCE:  This is an elderly African American female not in  acute distress.  HEENT:  Conjunctivae showed no erythema.  Ears no discharge.  Hearing is  intact.  Nose no infection or bleeding.  Oral mucosa is dry.  No  pharyngeal erythema.  Neck is supple.  No JVD, no carotid bruits, no  lymphadenopathy, no thyroid enlargement or thyroid tenderness.  CARDIOVASCULAR EXAMINATION:  S1-S2 regular.  There is a positive murmur.  There is no gallops and there is no thrills.  RESPIRATORY:  The patient is breathing between 20-22.  There is no use  of accessory muscles.  There is decrease breath sounds bilaterally,  especially on the left side and there is positive left side dullness.  There is positive rhonchi bilaterally, especially on the left side.  There are no wheezes.  ABDOMINAL EXAMINATION:  The abdomen is soft, nondistended, nontender.  No masses or hepatosplenomegaly.  Bowel sounds are normal.  Umbilicus is  central.  LOWER EXTREMITIES:  No edema, no rashes, no varicose veins.  SKIN:  No rashes, no erythema.  NEUROLOGICAL:  Mental status is nonfocal.  No motor or sensory deficits.   LAB RESULTS:  WBC 9.9,  hemoglobin 9.5, hematocrit 28.0, MCV 86.7,  platelets 342.  Sodium 136, potassium 4.8, chloride 106, bicarb 24,  glucose 130, BUN 34, creatinine 2.7.  Alkaline phosphate 228, AST 42,  ALT 28.  EKG showed normal sinus rhythm at 78 beats per minute with  nonspecific changes.  Chest x-ray showed left lower lobe infiltrate with  bilateral pleural effusion which is greater on the left side.   IMPRESSION AND ASSESSMENT:  1. Atypical chest pain:  This most likely secondary to her underlying      pneumonia but this patient has already been seen by cardiology and      echocardiogram was done last week.  I am not quite sure about the      results at this point.  I will give the previous records and get      the results of the echocardiogram.  I will admit her to telemetry.      I will get 3 sets of troponins and cardiac enzymes and I will get      cardiology consult with Dr. Armanda Magic for further evaluation.      The patient is supposed to follow up with Dr. Armanda Magic for a      stress test on February 16, 2007.  2. Pneumonia:  We will start her on intravenous Avelox.  We will give      oxygen as needed.  I will get a sputum and blood culture and will      put her on nebulizer treatment as needed.  3. Pleural effusion:  The left pleural effusion seems to be a large      collection of fluid.  I will get decubitus lateral x-ray for      further evaluation.  If the fluid is large enough, then we will      consider thoracentesis.  4. Hypertension:  We will continue current medications.  5. History of congestive heart failure:  Last ejection fraction is      more than 70% and this might represent systolic      dysfunction.  We will give Lasix 40 mg IV.  6. Hyperlipidemia:  We will continue current medications.   Assessment time is 1 hour.      Michelene Gardener, MD  Electronically Signed    NAE/MEDQ  D:  02/13/2007  T:  02/13/2007  Job:  [id number]  cc:   Donia Guiles, M.D.  Armanda Magic, M.D.

## 2011-05-06 NOTE — Discharge Summary (Signed)
[name], [name]                 ACCOUNT NO.:  [id number]   MEDICAL RECORD NO.:  [id number]          PATIENT TYPE:  INP   LOCATION:  5023                         FACILITY:  [facility]   PHYSICIAN:  Lubertha Basque. Dalldorf, M.D.DATE OF BIRTH:  [birth year]   DATE OF ADMISSION:  05/25/2006  DATE OF DISCHARGE:  05/30/2006                                 DISCHARGE SUMMARY   ADMISSION DIAGNOSES:  1.  Left knee end-stage degenerative joint disease.  2.  Hypertension.   DISCHARGE DIAGNOSES:  1.  Left knee end-stage degenerative joint disease.  2.  Hypertension.  3.  Blood loss anemia.  4.  Acute renal failure.   HISTORY:  Briefly, this patient is an 77 year old black female, a patient  well-known to our practice, with increasing left knee pain.  She is now  having pain with every step, trouble being comfortable at nighttime with  sleeping.  X-rays revealed end-stage DJD in her medial compartment and  patellofemoral compartment.  She has failed corticosteroid injections, anti-  inflammatory medications, and we have discussed with her total knee  replacement and the risks therein.   LABORATORY DATA:  Pertinent laboratory and x-ray findings:  EKG showed first  degree AV block.  White blood cell count is 8.0, hemoglobin showed a drop  postoperatively to 8.9 and then blood was transfused as necessary to 10.6,  platelet count at 226,000.  INRs done serially showed on low-dose Coumadin  protocol, last INR 2.5.  Her BUN was elevated, her creatinine was elevated,  last testing 30 BUN and creatinine 1.7, sodium 136, potassium 4.2.  Noted to  be O-positive, antibody screen negative blood type, and blood once again was  replaced as necessary.   HOSPITAL COURSE:  Postoperatively, the patient was admitted, was on IV Ancef  1 gram q.8h. times three doses, PCA and morphine pump, Coumadin, Lovenox  protocol per pharmacy for DVT prophylaxis, laxatives of choice, Percocet by  mouth, Reglan for nausea, ice to her  knee, knee immobilizer at nighttime.  CPM machine 0 to 50 advanced as tolerated.  Physical therapy to be  weightbearing as tolerated, and then follow-up lab studies as mentioned.  The first day post-op, her blood pressure was 103/54, temperature 99.4,  hemoglobin 9.3, INR 1.3, CPM was functioning 0 to 50, Foley catheter in  place.  Hemovac did seem to have some excessive drainage at 600-plus.  We  also had ordered a rehab consult.  Pharmacy was doing DVT prophylaxis  protocol with Lovenox and Coumadin.  The second day post-op, her vital signs  remained stable, but her hemoglobin continued to drop and her dressing was  changed, drained was pulled.  The wound was noted to be benign with no signs  of infection.  Calf was soft.  [facility] for  referral for possible discharge home when appropriate.  A hospital consult  note done by a hospitalist because of possible acute renal failure was done,  decreased blood pressure and urine production.  Temperature was also raised,  we thought possibly due to atelectasis and sent a spirometry  to bed with  therapy helped this situation.  She progressed well and was discharged home.   CONDITION ON DISCHARGE:  Improved.   FOLLOW UP:  Her medicine reconciliation sheet was signed and she was kept on  her home medications that she came to the hospital with which were:  1.  Lasix 40 mg a day.  2.  Atenolol 50 mg a day.  3.  Norvasc 10 mg a day.  4.  Lisinopril 10 mg a day.  5.  Percocet one or two every four to six hours p.r.n. pain.  6.  Coumadin for 30 days and pharmacy recommended 5 mg starting off a day      and then dropping to 2.5 Tuesdays, Thursdays, Saturdays, 5 mg the other      days with a pro-time to be checked by home health care - [facility].  7.  Also Lasix 40 mg once a day.  8.  Zestril 10 mg once a day will be continued.   Her dressing may be changed every two to three days.  She is walking with  crutches,  weightbearing as tolerated.  If any signs of infection, she is to  call [phone].  Also follow-up with the same number in seven to 10 days for  postoperative follow-up.  Diet is unrestricted other than coumadin  restrictions.      Lindwood Qua, P.A.      Lubertha Basque Jerl Santos, M.D.  Electronically Signed    MC/MEDQ  D:  06/20/2006  T:  06/20/2006  Job:  [id number]

## 2011-05-06 NOTE — Op Note (Signed)
NAMESADHANA, [name] NO.:  [id number]   MEDICAL RECORD NO.:  [id number]          PATIENT TYPE:  INP   LOCATION:  2899                         FACILITY:  [facility]   PHYSICIAN:  Lubertha Basque. Dalldorf, M.D.DATE OF BIRTH:  [birth year]   DATE OF PROCEDURE:  05/25/2006  DATE OF DISCHARGE:                                 OPERATIVE REPORT   PREOPERATIVE DIAGNOSIS:  Left knee degenerative arthritis.   POSTOPERATIVE DIAGNOSIS:  Left knee degenerative arthritis.   PROCEDURE:  Left total knee replacement.   ANESTHESIA:  General and block.   ATTENDING SURGEON:  Lubertha Basque. Jerl Santos, M.D.   ASSISTANT:  Lindwood Qua, P.A.   INDICATIONS FOR PROCEDURE:  The patient is an 77 year old woman with a long  history of left knee pain but a paucity of other medical conditions.  She  remains very active, but her left knee is limiting to her in that it makes  it difficult for her to walk and rest at this point.  She has failed  conservative measures of various oral anti-inflammatories and injectables.  By x-ray she has end-stage degenerative change at this knee.  She is offered  a knee replacement.  An informed operative consent was obtained after  discussion of the possible complications of, reaction to anesthesia,  infection, DVT, PE, and death.   SUMMARY:  Under general anesthesia and a block through a longitudinal  anterior incision, we performed a left total knee replacement.  She had  severe degeneration and fair good bone quality.  We corrected a mild varus  deformity.  [name] assisted throughout and was invaluable to the  completion of the case in that he retracted and helped with closure.  He was  the only assistant I had during the case.  We used the DePuy LCS system and  cemented all components with Zinacef included in the cement.  We used a size  standard femur, size 3 tibia, 12.5 mm deep dish spacer, and 38 all-  polyethylene patella.   DESCRIPTION OF PROCEDURE:   The patient went to the operating suite, where a  general anesthetic was applied without difficulty.  She was also given a  block in the preanesthesia area.  She was positioned supine and prepped and  draped in the normal sterile fashion.  After the administration of preop IV  Kefzol the left leg was elevated, exsanguinated, and a tourniquet inflated  about the thigh.  A longitudinal anterior incision was made with dissection  down to the extensor mechanism.  All appropriate anti-infective measures  were used including the preoperative IV antibiotic, a Betadine-impregnated  drape, and closed hooded exhaust systems for each member of the surgical  team.  A medial parapatellar incision was made through the extensor  mechanism.  The kneecap was flipped and the knee flexed.  Some large  osteophytes were removed from all three bones.  Some residual meniscal  tissues were removed along with ACL and PCL.  An extramedullary guide was  placed on the tibia in order to make a cut with a slight posterior tilt.  She had  fair bone quality.  An intramedullary guide was then placed in the  femur to make anterior and posterior cuts, creating a flexion gap of 12.5  mm.  A second intramedullary guide was placed in the femur to make a distal  cut, creating an equal extension gap of 12.5 mm balancing the knee.  Minimal  soft tissue release was required off the proximal aspect of the tibia  medially.  The femur sized to a standard, while the tibia sized to a 3, and  the appropriate guides were placed and utilized.  The patella was cut down  in thickness from 18 to 14.  This sized to a 38, and the appropriate guide  was placed and utilized.  A trial reduction was done with these components  and various spacers, and again the 12.5 seemed to fit best and give her the  best stability in flexion and extension.  Trial components were removed,  followed by pulsatile lavage of all three cut bony surfaces.  Cement was   mixed including Zinacef antibiotic.  This was then pressurized onto all  three bony surfaces.  The aforementioned DePuy LCS components were placed.  Excess cement was trimmed and pressure was held on the components until the  cement had hardened.  The tourniquet was deflated and a small amount of  bleeding was easily controlled with Bovie cautery.  The knee was thoroughly  irrigated, followed by placement of a drain exiting superolaterally.  The  extensor mechanism was reapproximated with #1 Vicryl in an interrupted  fashion.  The subcutaneous tissue was reapproximated in two layers with 0  and 2-0 undyed Vicryl and skin was closed with staples.  [name]  performed most of this closure to minimize OR time.  Adaptic was applied  followed by dry gauze and a loose Ace wrap.  The knee easily flexed to 120  degrees against gravity at the end of the case.  Estimated blood loss and  intraoperative fluids can be obtained from anesthesia records as can  accurate tourniquet time,which was under 1 hour.   DISPOSITION:  The patient was extubated in the operating room and taken to  recovery in stable addition.  She was to be admitted to the orthopedic  surgery service for appropriate postop care to include perioperative  antibiotics and Coumadin plus Lovenox for DVT prophylaxis.      Lubertha Basque Jerl Santos, M.D.  Electronically Signed     PGD/MEDQ  D:  05/25/2006  T:  05/25/2006  Job:  [id number]

## 2011-06-09 ENCOUNTER — Other Ambulatory Visit: Payer: Self-pay | Admitting: Family Medicine

## 2011-09-09 LAB — FERRITIN: Ferritin: 51 (ref 10–291)

## 2011-09-09 LAB — IRON AND TIBC
Iron: 107
TIBC: 321
UIBC: 214

## 2011-09-09 LAB — CBC
Hemoglobin: 12.7
RBC: 4.31
WBC: 4.2

## 2011-09-12 LAB — IRON AND TIBC: Iron: 107

## 2011-09-12 LAB — POCT HEMOGLOBIN-HEMACUE: Hemoglobin: 13.3

## 2011-09-16 LAB — IRON AND TIBC
Iron: 75
Saturation Ratios: 24
TIBC: 314
UIBC: 239

## 2011-09-23 LAB — CBC
HCT: 37
MCHC: 34.2
MCV: 85.4
Platelets: 192
RDW: 13.6

## 2011-09-27 LAB — IRON AND TIBC
Iron: 89
Saturation Ratios: 29
TIBC: 306

## 2011-09-27 LAB — CBC
HCT: 36.1
Platelets: 293
RDW: 13.3

## 2011-09-28 LAB — FERRITIN: Ferritin: 41 (ref 10–291)

## 2011-09-28 LAB — CBC
MCHC: 34
MCV: 86.3
RBC: 4.28

## 2011-09-28 LAB — IRON AND TIBC: Iron: 80

## 2011-09-29 LAB — CBC
HCT: 35.9 — ABNORMAL LOW
Hemoglobin: 12.4
RDW: 15 — ABNORMAL HIGH

## 2011-09-29 LAB — IRON AND TIBC
Saturation Ratios: 28
TIBC: 340

## 2011-10-03 LAB — CBC
RBC: 4.41
WBC: 4.1

## 2011-10-03 LAB — IRON AND TIBC
Iron: 83
Saturation Ratios: 24
TIBC: 340
UIBC: 257

## 2011-10-04 LAB — IRON AND TIBC: UIBC: 246

## 2011-10-04 LAB — FERRITIN: Ferritin: 41 (ref 10–291)

## 2011-10-04 LAB — CBC
HCT: 37
MCHC: 33.9
MCV: 79.5
Platelets: 250
WBC: 3.8 — ABNORMAL LOW

## 2013-02-24 ENCOUNTER — Emergency Department (HOSPITAL_COMMUNITY)
Admission: EM | Admit: 2013-02-24 | Discharge: 2013-02-24 | Disposition: A | Discharge status: Home / Self Care | Payer: Medicare Other | Attending: Emergency Medicine | Admitting: Emergency Medicine

## 2013-02-24 ENCOUNTER — Emergency Department (HOSPITAL_COMMUNITY): Payer: Medicare Other

## 2013-02-24 DIAGNOSIS — G309 Alzheimer's disease, unspecified: Secondary | ICD-10-CM | POA: Insufficient documentation

## 2013-02-24 DIAGNOSIS — R55 Syncope and collapse: Secondary | ICD-10-CM

## 2013-02-24 DIAGNOSIS — F0280 Dementia in other diseases classified elsewhere without behavioral disturbance: Secondary | ICD-10-CM | POA: Insufficient documentation

## 2013-02-24 LAB — URINALYSIS, ROUTINE W REFLEX MICROSCOPIC
Bilirubin Urine: NEGATIVE
Glucose, UA: NEGATIVE mg/dL
Hgb urine dipstick: NEGATIVE
Specific Gravity, Urine: 1.013 (ref 1.005–1.030)
pH: 6 (ref 5.0–8.0)

## 2013-02-24 LAB — BASIC METABOLIC PANEL
BUN: 27 mg/dL — ABNORMAL HIGH (ref 6–23)
Chloride: 108 mEq/L (ref 96–112)
Glucose, Bld: 80 mg/dL (ref 70–99)
Potassium: 4.9 mEq/L (ref 3.5–5.1)

## 2013-02-24 LAB — CBC WITH DIFFERENTIAL/PLATELET
HCT: 35.4 % — ABNORMAL LOW (ref 36.0–46.0)
Hemoglobin: 12.4 g/dL (ref 12.0–15.0)
Lymphs Abs: 0.7 10*3/uL (ref 0.7–4.0)
MCH: 30.5 pg (ref 26.0–34.0)
Monocytes Relative: 9 % (ref 3–12)
Neutro Abs: 2.1 10*3/uL (ref 1.7–7.7)
Neutrophils Relative %: 67 % (ref 43–77)
RBC: 4.07 MIL/uL (ref 3.87–5.11)

## 2013-02-24 MED ORDER — SODIUM CHLORIDE 0.9 % IV SOLN
INTRAVENOUS | Status: DC
  Administered 2013-02-24: 1000 mL via INTRAVENOUS

## 2013-02-24 NOTE — ED Notes (Signed)
Pt sitting up in bed drinking orange juice and eating cracker.Tol. Well.

## 2013-02-24 NOTE — ED Notes (Signed)
Pt was sitting in chair for lunch and family reported pt slumped forward and was not responsive. Pt had just had a BM per family. 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

## 2013-02-24 NOTE — ED Provider Notes (Signed)
History     CSN: [id number]  Arrival date & time 02/24/13  1429   First MD Initiated Contact with Patient 02/24/13 1431      Chief Complaint  Patient presents with  . Near Syncope    (Consider location/radiation/quality/duration/timing/severity/associated sxs/prior treatment) HPI Comments: [name] is a 79 y.o. female who was sitting down for lunch today, ate a few bites, then slumped forward. Her grandson was with her, and felt like she lost consciousness. She also defecated at that time. She is typically continent of stool. After arrival in the ED, grandson, states that she is at her normal baseline. She has confusion related to "Alzheimer's".   Level V caveat: Dementia  The history is provided by the patient and a relative.    No past medical history on file.  No past surgical history on file.  No family history on file.  History  Substance Use Topics  . Smoking status: Not on file  . Smokeless tobacco: Not on file  . Alcohol Use: Not on file    OB History   No data available      Review of Systems  Unable to perform ROS   Allergies  Review of patient's allergies indicates no known allergies.  Home Medications   Current Outpatient Rx  Name  Route  Sig  Dispense  Refill  . Multiple Vitamin (MULTIVITAMIN WITH MINERALS) TABS   Oral   Take 1 tablet by mouth daily.           BP 147/59  Pulse 63  Temp(Src) 97.4 F (36.3 C) (Oral)  Resp 24  SpO2 100%  Physical Exam  Nursing note and vitals reviewed. Constitutional: She appears well-developed.  Elderly, frail  HENT:  Head: Normocephalic and atraumatic.  Eyes: Conjunctivae and EOM are normal. Pupils are equal, round, and reactive to light.  Right cornea is opaque  Neck: Normal range of motion and phonation normal. Neck supple.  Cardiovascular: Normal rate, regular rhythm and intact distal pulses.   Pulmonary/Chest: Effort normal and breath sounds normal. She exhibits no tenderness.   Abdominal: Soft. She exhibits no distension. There is no tenderness. There is no guarding.  Musculoskeletal: Normal range of motion.  Neurological: She is alert. She has normal strength. She exhibits normal muscle tone.  No dysarthria, no aphasia. She follows simple commands accurately.  She has confused.  Skin: Skin is warm and dry.  Psychiatric: She has a normal mood and affect. Her behavior is normal.    ED Course  Procedures (including critical care time)  Emergency department treatment: IV fluid, normal saline  Reevaluation: 19:20-repeat vital signs are reassuring. Family members with the patient states that she is at her baseline. I will ask the nurse, to do an ambulation trial, and fluid trial.    Date: 10/05/2012  Rate: 64  Rhythm: normal sinus rhythm  QRS Axis: normal  PR and QT Intervals: PR prolonged  ST/T Wave abnormalities: normal  PR and QRS Conduction Disutrbances:first-degree A-V block   Narrative Interpretation:   Old EKG Reviewed: none available   Labs Reviewed  CBC WITH DIFFERENTIAL - Abnormal; Notable for the following:    WBC 3.2 (*)    HCT 35.4 (*)    All other components within normal limits  BASIC METABOLIC PANEL - Abnormal; Notable for the following:    BUN 27 (*)    Creatinine, Ser 1.74 (*)    GFR calc non Af Amer 24 (*)    GFR calc Af  Amer 28 (*)    All other components within normal limits  URINALYSIS, ROUTINE W REFLEX MICROSCOPIC - Abnormal; Notable for the following:    Protein, ur 30 (*)    All other components within normal limits  URINE MICROSCOPIC-ADD ON - Abnormal; Notable for the following:    Squamous Epithelial / LPF FEW (*)    All other components within normal limits  URINE CULTURE   Dg Chest 2 View  02/24/2013  *RADIOLOGY REPORT*  Clinical Data: Near-syncope  CHEST - 2 VIEW  Comparison: 07/30/2009  Findings: Low lung volumes.  No focal consolidation.  Stable mild elevation of the left hemidiaphragm. No pleural effusion or  pneumothorax.  The heart is normal in size.  Degenerative changes of the visualized thoracolumbar spine.  IMPRESSION: No evidence of acute cardiopulmonary disease.   Original Report Authenticated By: Charline Bills, M.D.    Ct Head Wo Contrast  02/24/2013  *RADIOLOGY REPORT*  Clinical Data: Near-syncope, blind in right eye  CT HEAD WITHOUT CONTRAST  Technique:  Contiguous axial images were obtained from the base of the skull through the vertex without contrast.  Comparison: None  Findings: Generalized atrophy. Normal ventricular morphology. No midline shift or mass effect. Small vessel chronic ischemic changes of deep cerebral white matter. No intracranial hemorrhage, mass lesion, or acute infarction. 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 Visualized paranasal sinuses and mastoid air cells clear. Bones unremarkable. Atherosclerotic calcifications at skull base.  IMPRESSION: Atrophy with small vessel chronic ischemic changes of deep cerebral white matter. 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 No acute intracranial abnormalities.   Original Report Authenticated By: Ulyses Southward, M.D.    Nursing notes, applicable records and vitals reviewed.  Radiologic Images/Reports reviewed.   1. Syncope       MDM  Syncope, without clear cause. Patient improved spontaneously. She is at her baseline. She has tolerated food and fluid orally in the ED.Doubt metabolic instability, serious bacterial infection or impending vascular collapse; the patient is stable for discharge.   Plan: Home Medications- usual; Home Treatments- rest; Recommended follow up- PCP 2 days      Flint Melter, MD 02/24/13 2013

## 2013-02-24 NOTE — ED Notes (Signed)
Pt ambulated using a cane . Pt tol. Well.

## 2013-02-26 LAB — URINE CULTURE: Culture: NO GROWTH

## 2014-03-27 ENCOUNTER — Emergency Department (HOSPITAL_COMMUNITY): Payer: Medicare Other

## 2014-03-27 ENCOUNTER — Inpatient Hospital Stay (HOSPITAL_COMMUNITY)
Admission: EM | Admit: 2014-03-27 | Discharge: 2014-04-02 | DRG: 871 | Disposition: A | Discharge status: Hospice | Payer: Medicare Other | Attending: Internal Medicine | Admitting: Internal Medicine

## 2014-03-27 ENCOUNTER — Encounter (HOSPITAL_COMMUNITY): Payer: Self-pay | Admitting: Emergency Medicine

## 2014-03-27 DIAGNOSIS — Z7401 Bed confinement status: Secondary | ICD-10-CM

## 2014-03-27 DIAGNOSIS — E162 Hypoglycemia, unspecified: Secondary | ICD-10-CM

## 2014-03-27 DIAGNOSIS — R1310 Dysphagia, unspecified: Secondary | ICD-10-CM | POA: Diagnosis present

## 2014-03-27 DIAGNOSIS — E43 Unspecified severe protein-calorie malnutrition: Secondary | ICD-10-CM | POA: Diagnosis present

## 2014-03-27 DIAGNOSIS — A419 Sepsis, unspecified organism: Secondary | ICD-10-CM

## 2014-03-27 DIAGNOSIS — IMO0002 Reserved for concepts with insufficient information to code with codable children: Secondary | ICD-10-CM

## 2014-03-27 DIAGNOSIS — J189 Pneumonia, unspecified organism: Secondary | ICD-10-CM

## 2014-03-27 DIAGNOSIS — G9340 Encephalopathy, unspecified: Secondary | ICD-10-CM

## 2014-03-27 DIAGNOSIS — I2510 Atherosclerotic heart disease of native coronary artery without angina pectoris: Secondary | ICD-10-CM | POA: Diagnosis present

## 2014-03-27 DIAGNOSIS — R5383 Other fatigue: Secondary | ICD-10-CM

## 2014-03-27 DIAGNOSIS — N179 Acute kidney failure, unspecified: Secondary | ICD-10-CM | POA: Diagnosis present

## 2014-03-27 DIAGNOSIS — D696 Thrombocytopenia, unspecified: Secondary | ICD-10-CM | POA: Diagnosis present

## 2014-03-27 DIAGNOSIS — R680 Hypothermia, not associated with low environmental temperature: Secondary | ICD-10-CM | POA: Diagnosis present

## 2014-03-27 DIAGNOSIS — R0609 Other forms of dyspnea: Secondary | ICD-10-CM | POA: Diagnosis present

## 2014-03-27 DIAGNOSIS — E860 Dehydration: Secondary | ICD-10-CM

## 2014-03-27 DIAGNOSIS — Z681 Body mass index (BMI) 19 or less, adult: Secondary | ICD-10-CM

## 2014-03-27 DIAGNOSIS — E870 Hyperosmolality and hypernatremia: Secondary | ICD-10-CM

## 2014-03-27 DIAGNOSIS — R627 Adult failure to thrive: Secondary | ICD-10-CM | POA: Diagnosis present

## 2014-03-27 DIAGNOSIS — F0390 Unspecified dementia without behavioral disturbance: Secondary | ICD-10-CM

## 2014-03-27 DIAGNOSIS — I129 Hypertensive chronic kidney disease with stage 1 through stage 4 chronic kidney disease, or unspecified chronic kidney disease: Secondary | ICD-10-CM | POA: Diagnosis present

## 2014-03-27 DIAGNOSIS — R5381 Other malaise: Secondary | ICD-10-CM | POA: Diagnosis present

## 2014-03-27 DIAGNOSIS — R0989 Other specified symptoms and signs involving the circulatory and respiratory systems: Secondary | ICD-10-CM | POA: Diagnosis present

## 2014-03-27 DIAGNOSIS — Z66 Do not resuscitate: Secondary | ICD-10-CM

## 2014-03-27 DIAGNOSIS — N189 Chronic kidney disease, unspecified: Secondary | ICD-10-CM | POA: Diagnosis present

## 2014-03-27 DIAGNOSIS — D649 Anemia, unspecified: Secondary | ICD-10-CM

## 2014-03-27 DIAGNOSIS — I498 Other specified cardiac arrhythmias: Secondary | ICD-10-CM | POA: Diagnosis present

## 2014-03-27 DIAGNOSIS — R001 Bradycardia, unspecified: Secondary | ICD-10-CM

## 2014-03-27 DIAGNOSIS — Z515 Encounter for palliative care: Secondary | ICD-10-CM

## 2014-03-27 DIAGNOSIS — T68XXXA Hypothermia, initial encounter: Secondary | ICD-10-CM

## 2014-03-27 DIAGNOSIS — D61818 Other pancytopenia: Secondary | ICD-10-CM | POA: Diagnosis present

## 2014-03-27 HISTORY — DX: Dementia in other diseases classified elsewhere, unspecified severity, without behavioral disturbance, psychotic disturbance, mood disturbance, and anxiety: F02.80

## 2014-03-27 HISTORY — DX: Alzheimer's disease, unspecified: G30.9

## 2014-03-27 HISTORY — DX: Essential (primary) hypertension: I10

## 2014-03-27 HISTORY — DX: Unspecified dementia, unspecified severity, without behavioral disturbance, psychotic disturbance, mood disturbance, and anxiety: F03.90

## 2014-03-27 HISTORY — DX: Disorder of kidney and ureter, unspecified: N28.9

## 2014-03-27 LAB — CBC WITH DIFFERENTIAL/PLATELET
BASOS ABS: 0 10*3/uL (ref 0.0–0.1)
Basophils Relative: 0 % (ref 0–1)
EOS PCT: 2 % (ref 0–5)
Eosinophils Absolute: 0.1 10*3/uL (ref 0.0–0.7)
HEMATOCRIT: 28.1 % — AB (ref 36.0–46.0)
Hemoglobin: 9.7 g/dL — ABNORMAL LOW (ref 12.0–15.0)
LYMPHS ABS: 0.8 10*3/uL (ref 0.7–4.0)
LYMPHS PCT: 24 % (ref 12–46)
MCH: 30.3 pg (ref 26.0–34.0)
MCHC: 34.5 g/dL (ref 30.0–36.0)
MCV: 87.8 fL (ref 78.0–100.0)
MONO ABS: 0.3 10*3/uL (ref 0.1–1.0)
Monocytes Relative: 10 % (ref 3–12)
NEUTROS ABS: 2.1 10*3/uL (ref 1.7–7.7)
Neutrophils Relative %: 64 % (ref 43–77)
PLATELETS: 95 10*3/uL — AB (ref 150–400)
RBC: 3.2 MIL/uL — AB (ref 3.87–5.11)
RDW: 16.4 % — AB (ref 11.5–15.5)
WBC: 3.3 10*3/uL — AB (ref 4.0–10.5)

## 2014-03-27 LAB — URINE MICROSCOPIC-ADD ON

## 2014-03-27 LAB — COMPREHENSIVE METABOLIC PANEL
ALT: 24 U/L (ref 0–35)
AST: 70 U/L — AB (ref 0–37)
Albumin: 3.1 g/dL — ABNORMAL LOW (ref 3.5–5.2)
Alkaline Phosphatase: 110 U/L (ref 39–117)
BILIRUBIN TOTAL: 0.5 mg/dL (ref 0.3–1.2)
BUN: 37 mg/dL — ABNORMAL HIGH (ref 6–23)
CHLORIDE: 115 meq/L — AB (ref 96–112)
CO2: 22 meq/L (ref 19–32)
Calcium: 9.6 mg/dL (ref 8.4–10.5)
Creatinine, Ser: 2.26 mg/dL — ABNORMAL HIGH (ref 0.50–1.10)
GFR, EST AFRICAN AMERICAN: 20 mL/min — AB (ref >90)
GFR, EST NON AFRICAN AMERICAN: 18 mL/min — AB (ref >90)
GLUCOSE: 56 mg/dL — AB (ref 70–99)
Potassium: 4.6 mEq/L (ref 3.7–5.3)
SODIUM: 150 meq/L — AB (ref 137–147)
Total Protein: 6.5 g/dL (ref 6.0–8.3)

## 2014-03-27 LAB — CBG MONITORING, ED
GLUCOSE-CAPILLARY: 86 mg/dL (ref 70–99)
Glucose-Capillary: 50 mg/dL — ABNORMAL LOW (ref 70–99)
Glucose-Capillary: 108 mg/dL — ABNORMAL HIGH (ref 70–99)

## 2014-03-27 LAB — URINALYSIS, ROUTINE W REFLEX MICROSCOPIC
BILIRUBIN URINE: NEGATIVE
GLUCOSE, UA: NEGATIVE mg/dL
HGB URINE DIPSTICK: NEGATIVE
KETONES UR: NEGATIVE mg/dL
Leukocytes, UA: NEGATIVE
Nitrite: NEGATIVE
PROTEIN: 100 mg/dL — AB
Specific Gravity, Urine: 1.013 (ref 1.005–1.030)
UROBILINOGEN UA: 0.2 mg/dL (ref 0.0–1.0)
pH: 5 (ref 5.0–8.0)

## 2014-03-27 LAB — I-STAT CG4 LACTIC ACID, ED: Lactic Acid, Venous: 0.81 mmol/L (ref 0.5–2.2)

## 2014-03-27 LAB — RETICULOCYTES
RBC.: 3.2 MIL/uL — ABNORMAL LOW (ref 3.87–5.11)
RETIC COUNT ABSOLUTE: 44.8 10*3/uL (ref 19.0–186.0)
RETIC CT PCT: 1.4 % (ref 0.4–3.1)

## 2014-03-27 LAB — TROPONIN I: Troponin I: <0.3 ng/mL (ref <0.30)

## 2014-03-27 LAB — POC OCCULT BLOOD, ED: FECAL OCCULT BLD: POSITIVE — AB

## 2014-03-27 MED ORDER — SODIUM CHLORIDE 0.9 % IV SOLN
INTRAVENOUS | Status: DC
  Administered 2014-03-27: 18:00:00 via INTRAVENOUS

## 2014-03-27 MED ORDER — DEXTROSE 5 % IV SOLN
1.0000 g | INTRAVENOUS | Status: DC
  Administered 2014-03-28: 1 g via INTRAVENOUS
  Filled 2014-03-27 (×2): qty 10

## 2014-03-27 MED ORDER — DEXTROSE 5 % IV SOLN
500.0000 mg | INTRAVENOUS | Status: DC
  Administered 2014-03-28: 500 mg via INTRAVENOUS
  Filled 2014-03-27 (×2): qty 500

## 2014-03-27 MED ORDER — SODIUM CHLORIDE 0.9 % IV BOLUS (SEPSIS)
1000.0000 mL | Freq: Once | INTRAVENOUS | Status: AC
  Administered 2014-03-27: 1000 mL via INTRAVENOUS

## 2014-03-27 MED ORDER — DEXTROSE 50 % IV SOLN
1.0000 | Freq: Once | INTRAVENOUS | Status: AC
  Administered 2014-03-27: 50 mL via INTRAVENOUS
  Filled 2014-03-27: qty 50

## 2014-03-27 MED ORDER — DEXTROSE-NACL 5-0.45 % IV SOLN
INTRAVENOUS | Status: DC
Start: 2014-03-27 — End: 2014-03-29
  Administered 2014-03-27 – 2014-03-29 (×4): via INTRAVENOUS

## 2014-03-27 MED ORDER — ENOXAPARIN SODIUM 30 MG/0.3ML ~~LOC~~ SOLN: 30.0000 mg | SUBCUTANEOUS | Status: DC

## 2014-03-27 MED ORDER — DEXTROSE 5 % IV SOLN
1.0000 g | Freq: Once | INTRAVENOUS | Status: AC
  Administered 2014-03-27: 1 g via INTRAVENOUS
  Filled 2014-03-27: qty 10

## 2014-03-27 MED ORDER — DEXTROSE 5 % IV SOLN
500.0000 mg | Freq: Once | INTRAVENOUS | Status: AC
  Administered 2014-03-27: 500 mg via INTRAVENOUS

## 2014-03-27 NOTE — ED Notes (Signed)
Per EMS, from home with son, pt son sts she has had decreased LOC for 1 week that has gotten progressively worse. Pt. Has end stage alzheimers and dementia. Pt has not been taking medications for over a year because they can not afford them.

## 2014-03-27 NOTE — ED Notes (Signed)
Pt answering "uh huh" to questions or when told to do something.

## 2014-03-27 NOTE — H&P (Signed)
History and Physical       Hospital Admission Note Date: 03/27/2014  Patient name: [name] Medical record number: [id number] Date of birth: 1918/04/10 Age: 80 y.o. Gender: female  PCP: Cam Hai, CNM    Chief Complaint:  Altered mental status worsened in the last 3-4 days  HPI: Patient is a 80 year old female with history of hypertension, CAD, advanced dementia, failure to thrive, currently living at home with her son, not on any medications was brought to the ER due to altered mental status, decreased by mouth intake in the last 3-4 days. History was obtained from the grand daughter-in-law in the room who stated that at baseline she has advanced dementia, has been worsening, requiring assistance with her ADLs, was able to feed herself. Her son (who has left ) had reported that on Monday, he noticed that she was getting weaker and confused. Today when he came home, she was almost obtunded and not following commands. The patient has not seen any physician over one year due to financial reasons and is not on any medications.  ER workup showed initial hypothermia of 94.2, BP of 128/50 heart rate in 40-60's. WBCs 3.3, hemoglobin 9.7, hematocrit 28.1 sodium 150, chloride 115, bicarbonate 22, BUN 37, creatinine 2.26, glucose 56 Lactic acid 0.8 chest x-ray showed left lung pneumonia  Review of Systems:  Unable to obtain from the patient due to her advanced dementia and current mental status  Past Medical History: Past Medical History  Diagnosis Date  . Dementia   . Alzheimer's dementia   . Renal disorder   . Hypertension    History reviewed. No pertinent past surgical history.  Medications: Prior to Admission medications   Not on File    Allergies:  No Known Allergies  Social History:  Per the chart review reports that she has never smoked. She has never used smokeless tobacco. She reports that she does not drink  alcohol or use illicit drugs.  Family History: No family history on file.  Physical Exam: Blood pressure 128/50, pulse 56, temperature 94.7 F (34.8 C), temperature source Rectal, resp. rate 14, SpO2 98.00%. General: Obtunded, does not follow any commands, only moans to pain stimuli HEENT: normocephalic, atraumatic, anicteric sclera, pink conjunctiva, pupils equal and reactive to light and accomodation Neck: supple, no masses or lymphadenopathy, no goiter, no bruits  Heart: Regular rate and rhythm, without murmurs, rubs or gallops. Lungs: Clear to auscultation bilaterally, no wheezing, rales or rhonchi. Abdomen: Soft, nontender, nondistended, positive bowel sounds, no masses. Extremities: No clubbing, cyanosis or edema with positive pedal pulses. Neuro: Does not follow any verbal commands, however withdraws both extremities on pain stimuli Psych: Obtunded does not follow verbal commands Skin: no rashes or lesions, warm and dry   LABS on Admission:  Basic Metabolic Panel:  Recent Labs Lab 03/27/14 1630  NA 150*  K 4.6  CL 115*  CO2 22  GLUCOSE 56*  BUN 37*  CREATININE 2.26*  CALCIUM 9.6   Liver Function Tests:  Recent Labs Lab 03/27/14 1630  AST 70*  ALT 24  ALKPHOS 110  BILITOT 0.5  PROT 6.5  ALBUMIN 3.1*   No results found for this basename: LIPASE, AMYLASE,  in the last 168 hours No results found for this basename: AMMONIA,  in the last 168 hours CBC:  Recent Labs Lab 03/27/14 1630  WBC 3.3*  NEUTROABS 2.1  HGB 9.7*  HCT 28.1*  MCV 87.8  PLT 95*   Cardiac Enzymes:  Recent Labs  Lab 03/27/14 1630  TROPONINI <0.30   BNP: No components found with this basename: POCBNP,  CBG:  Recent Labs Lab 03/27/14 1628 03/27/14 1822  GLUCAP 50* 108*     Radiological Exams on Admission: Dg Chest 2 View  03/27/2014   CLINICAL DATA:  Altered mental status, evaluate for infiltrate  EXAM: CHEST  2 VIEW  COMPARISON:  DG CHEST 2 VIEW dated 02/24/2013; CT HEAD  W/O CM dated 03/27/2014  FINDINGS: Heart size is upper normal. Vascular pattern is normal. There is interstitial infiltrate in the retrocardiac portion of the left lower lobe seen on both the PA and lateral view. The lungs are otherwise clear. There are no pleural effusions.  IMPRESSION: Left lower lobe infiltrate.  This could represent pneumonia.   Electronically Signed   By: Esperanza Heiraymond  Rubner M.D.   On: 03/27/2014 17:14   Ct Head Wo Contrast  03/27/2014   CLINICAL DATA:  Altered mental status; dementia  EXAM: CT HEAD WITHOUT CONTRAST  TECHNIQUE: Contiguous axial images were obtained from the base of the skull through the vertex without intravenous contrast. Study was obtained within 24 hr of patient's arrival at the emergency department.  COMPARISON:  February 24, 2013  FINDINGS: There is moderate diffuse atrophy. There is no mass, hemorrhage, extra-axial fluid collection, or midline shift. There is patchy small vessel disease in the centra semiovale bilaterally. There is a prior infarct in the periphery of the inferior right cerebellum. There is a lacunar type infarct in the posterior mid left cerebellum.  Bony calvarium appears intact. There is a small enostosis arising from the superior left temporal bone. The mastoid air cells are clear.  IMPRESSION: Atrophy with small vessel disease and prior small infarcts. No acute appearing infarct present. No hemorrhage or mass effect.   Electronically Signed   By: Bretta BangWilliam  Woodruff M.D.   On: 03/27/2014 16:50    Assessment/Plan Principal Problem:   Acute encephalopathy: Multifactorial, Likely worsened due to severe dehydration, failure to thrive, hypernatremia with acute renal insufficiency and community-acquired pneumonia, hypothermia and hypoglycemia - Patient will be admitted to step down unit, obtain blood cultures, urine cultures, influenza panel, urine Legionella, urine strep antigen - Placed on IV Zithromax and Rocephin, IV fluids - N.p.o. until more alert and  awake, then swallow evaluation  Active Problems:   CAP (community acquired pneumonia) - As #1, IV fluids and IV antibiotics, follow cultures    Acute renal failure with hypernatremia:  - Patient has been given IV fluid bolus with normal saline, currently at NS at 125cc/hr, will place on D5 half normal after 2 L are completed - Check weight to calculate for the water deficit    Hypothermia: due to infection and profound hypoglycemia - Placed on D5 half normal saline, [place] to treat pneumonia    Anemia with  Thrombocytopenia: Likely pancytopenia - Will obtain anemia panel, fecal occult blood tests     DementiaWith  Failure to thriveAnd severe protein calorie malnutrition - I had extensive discussion with patient's daughter-in-law in the room regarding her current medical status  however patient's son had already left. He had requested full CODE STATUS. Patient will benefit from palliative goals of care and possibly nursing facility for higher level of care.   DVT prophylaxis: Lovenox  CODE STATUS: Currently full CODE STATUS  Family Communication: Admission, patients condition and plan of care including tests being ordered have been discussed with the  daughter-in-law  who indicates understanding and agree with the plan and Code Status  Further plan will depend as patient's clinical course evolves and further radiologic and laboratory data become available.   Time Spent on Admission: 1 hour  Heela Heishman Jenna Luo M.D. [facility] 03/27/2014, 6:27 PM Pager: [phone]  If 7PM-7AM, please contact night-coverage [email or web address] Password [email or web address]

## 2014-03-27 NOTE — Progress Notes (Signed)
Utilization Review completed.  Yarisbel Miranda RN CM  

## 2014-03-27 NOTE — ED Notes (Signed)
Bear hugger applied on pt

## 2014-03-27 NOTE — ED Notes (Signed)
Pts son states the past week she has had decrease LOC, states she usually answers "uh huh" to questions and has not been doing that.

## 2014-03-27 NOTE — ED Notes (Signed)
Pt resting in bed, nonverbal, no family at bed.

## 2014-03-27 NOTE — ED Provider Notes (Signed)
TIME SEEN: 3:51 PM  CHIEF COMPLAINT: Altered mental status, decreased by mouth intake, generalized weakness  HPI: Patient is a 80 year old female with a history of hypertension, chronic kidney disease, dementia who presents emergency department with 4 days of decreased by mouth intake, generalized weakness and "out of it" per her son. Patient lives with her son and normally uses a cane at baseline but for the past month her son reports she has been bedbound. He states that she is normally able to dress herself and feed herself but has not been able to use over the past several days. He denies a history of head injury. She is on anticoagulation. No fevers that he is aware of. No vomiting or diarrhea. No cough. He states normally the patient is able to answer questions and follow commands which she is not able to do currently. He states she is a full code.  Her PCP was Dr. Karleen Hampshire but patient's son reports the patient has not seen doctors concerned over 1 year due to financial reasons. She is also not on medications for over one year.  Son Molly Maduro [phone]  ROS: Unobtainable secondary to patient's dementia  PAST MEDICAL HISTORY/PAST SURGICAL HISTORY:  No past medical history on file.  MEDICATIONS:  Prior to Admission medications   Medication Sig Start Date End Date Taking? Authorizing Provider  Multiple Vitamin (MULTIVITAMIN WITH MINERALS) TABS Take 1 tablet by mouth daily.    Historical Provider, MD    ALLERGIES:  No Known Allergies  SOCIAL HISTORY:  History  Substance Use Topics  . Smoking status: Not on file  . Smokeless tobacco: Not on file  . Alcohol Use: Not on file    FAMILY HISTORY: No family history on file.  EXAM: BP 166/91  Pulse 66  Resp 16  SpO2 100% CONSTITUTIONAL: Alert but does not respond appropriately to questions. Will follow some commands intermittently. Elderly, cachectic HEAD: Normocephalic EYES: Conjunctivae clear, PERRL ENT: normal nose; no  rhinorrhea; dry mucous membranes; pharynx without lesions noted; poor dentition NECK: Supple, no meningismus, no LAD  CARD: RRR; S1 and S2 appreciated; no murmurs, no clicks, no rubs, no gallops RESP: Normal chest excursion without splinting or tachypnea; breath sounds clear and equal bilaterally; no wheezes, no rhonchi, no rales,  ABD/GI: Normal bowel sounds; non-distended; soft, non-tender, no rebound, no guarding RECTAL: Diminished rectal tone, no gross blood or melena, no decubitus ulcers BACK:  The back appears normal and is non-tender to palpation, there is no CVA tenderness EXT: Normal ROM in all joints; non-tender to palpation; no edema; normal capillary refill; no cyanosis    SKIN: Normal color for age and race; warm NEURO: Moves all extremities equally, no facial droop, will only follow some commands intermittently; does not answer questions PSYCH: The patient's mood and manner are appropriate. Grooming and personal hygiene are appropriate.  MEDICAL DECISION MAKING: Patient here with altered mental status, decreased by mouth intake and denies weakness for the past several days. She has no focal neurologic deficits on exam but will not answer questions or follow commands appropriately. She is hemodynamically stable. We'll obtain AMS workup with CXR, urine, labs, head CT.  Will give IVF as pt appears dehydrated.  ED PROGRESS: Patient is hypothermic. Will use Bair Hugger and obtain blood cultures, urine cultures, lactate.   4:57 PM  Pt's CBG was 50. She was given one amp of D50 and is now more alert and will answer yes or no to questions.  5:54 PM  Pt's CXR  shows LLL infiltrate.  Will treat for community acquired pneumonia. She also has acute renal failure and hypernatremia likely from dehydration. Will discuss with hospitalist for admission. Head CT shows no acute intracranial abnormalities.   6:10 PM  Updated patient's son regarding admission. Discuss with Dr. Isidoro Donningai for  admission.  CRITICAL CARE Performed by: Layla MawKristen N Ward   Total critical care time: 40 minutes  Critical care time was exclusive of separately billable procedures and treating other patients.  Critical care was necessary to treat or prevent imminent or life-threatening deterioration.  Critical care was time spent personally by me on the following activities: development of treatment plan with patient and/or surrogate as well as nursing, discussions with consultants, evaluation of patient's response to treatment, examination of patient, obtaining history from patient or surrogate, ordering and performing treatments and interventions, ordering and review of laboratory studies, ordering and review of radiographic studies, pulse oximetry and re-evaluation of patient's condition.    EKG Interpretation  Date/Time:  Thursday March 27 2014 16:24:50 EDT Ventricular Rate:  69 PR Interval:  78 QRS Duration: 83 QT Interval:  500 QTC Calculation: 536 R Axis:   39 Text Interpretation:  Atrial-paced complexes Borderline repolarization abnormality Prolonged QT interval Artifact in lead(s) I II III aVR aVL aVF V2 V4 V5 V6 No significant change since last tracing Confirmed by WARD,  DO, KRISTEN [id number]) on 03/27/2014 4:32:20 PM        Layla MawKristen N Ward, DO 03/27/14 1810

## 2014-03-27 NOTE — Progress Notes (Signed)
   CARE MANAGEMENT ED NOTE 03/27/2014  Patient:  [name],[name]   Account Number:  [id number]  Date Initiated:  03/27/2014  Documentation initiated by:  Radford PaxFERRERO,Caelin Rosen  Subjective/Objective Assessment:   Patien tpresents to Ed with altered mental status, decreased po intake for 3-4 days     Subjective/Objective Assessment Detail:   chest xray revealed pneumonia, temp 94.2 WBC 3.3     Action/Plan:   Action/Plan Detail:   Anticipated DC Date:       Status Recommendation to Physician:   Result of Recommendation:    Other ED Services  Consult Working Plan    DC [employer]  Other    Choice offered to / List presented to:            Status of service:  Completed, signed off  ED Comments:   ED Comments Detail:  EDCM attempted to speak to patient at bedside, however no family present and patiient asleep.  As per chart, patient lives at home with her son.  Patient with pmhx of Alzheimer's dementia.

## 2014-03-27 NOTE — ED Notes (Signed)
Bed: WA02 Expected date:  Expected time:  Means of arrival:  Comments: ems- 80 yo F, decreased LOC

## 2014-03-27 NOTE — Progress Notes (Signed)
Clinical Social Work Department BRIEF PSYCHOSOCIAL ASSESSMENT 03/27/2014  Patient:  [name],[name]     Account Number:  [id number]     Admit date:  03/27/2014  Clinical Social Worker:  Jamelle Rushingorbett,Mikayah Joy, LCSWA  Date/Time:  03/27/2014 07:00 PM  Referred by:  CSW  Date Referred:  03/27/2014  Other Referral:   Interview type:  Family Other interview type:   Patient would not easily wake therefore CSW spoke with son.    PSYCHOSOCIAL DATA Living Status:  FAMILY Admitted from facility:   Level of care:   Primary support name:  [name] Primary support relationship to patient:  CHILD, ADULT Degree of support available:   High    CURRENT CONCERNS  Other Concerns:    SOCIAL WORK ASSESSMENT / PLAN CSW attempted to engage patient at bedside but was unsuccessful due to inability to wake.  CSW called and spoke with the patient's son [name] for collateral information.  At this time the family would like for her to return home but is willing to follow the recommendations of the physician for after care plans.  CSW provided the son with some information of SNF in the area and also [facility].  Son agreed to follow up with the CSW on the unit for further guidance for after care plans.   Assessment/plan status:  Psychosocial Support/Ongoing Assessment of Needs Other assessment/ plan:   Information/referral to community resources:   SNF-verbal information provided over the phone    PATIENT'S/FAMILY'S RESPONSE TO PLAN OF CARE: Son expressed his appreciation of the support provided by the social work department.       Maryelizabeth Rowanressa Elen Acero, MSW, KrumLCSWA, 03/27/2014 Evening Clinical Social Worker [phone]

## 2014-03-27 NOTE — ED Notes (Signed)
Patient transported to X-ray 

## 2014-03-27 NOTE — ED Notes (Signed)
Dr. Isidoro Donningai informed of pt's temperature and cbg.

## 2014-03-28 DIAGNOSIS — J189 Pneumonia, unspecified organism: Secondary | ICD-10-CM

## 2014-03-28 DIAGNOSIS — T68XXXA Hypothermia, initial encounter: Secondary | ICD-10-CM

## 2014-03-28 DIAGNOSIS — A419 Sepsis, unspecified organism: Secondary | ICD-10-CM | POA: Diagnosis present

## 2014-03-28 DIAGNOSIS — R001 Bradycardia, unspecified: Secondary | ICD-10-CM | POA: Diagnosis present

## 2014-03-28 DIAGNOSIS — N179 Acute kidney failure, unspecified: Secondary | ICD-10-CM

## 2014-03-28 DIAGNOSIS — G9340 Encephalopathy, unspecified: Secondary | ICD-10-CM

## 2014-03-28 DIAGNOSIS — E860 Dehydration: Secondary | ICD-10-CM

## 2014-03-28 DIAGNOSIS — E162 Hypoglycemia, unspecified: Secondary | ICD-10-CM

## 2014-03-28 DIAGNOSIS — F0390 Unspecified dementia without behavioral disturbance: Secondary | ICD-10-CM

## 2014-03-28 LAB — BASIC METABOLIC PANEL
BUN: 31 mg/dL — ABNORMAL HIGH (ref 6–23)
CALCIUM: 8.5 mg/dL (ref 8.4–10.5)
CO2: 20 mEq/L (ref 19–32)
Chloride: 116 mEq/L — ABNORMAL HIGH (ref 96–112)
Creatinine, Ser: 2.11 mg/dL — ABNORMAL HIGH (ref 0.50–1.10)
GFR calc non Af Amer: 19 mL/min — ABNORMAL LOW (ref >90)
GFR, EST AFRICAN AMERICAN: 22 mL/min — AB (ref >90)
GLUCOSE: 104 mg/dL — AB (ref 70–99)
Potassium: 4.5 mEq/L (ref 3.7–5.3)
SODIUM: 147 meq/L (ref 137–147)

## 2014-03-28 LAB — FOLATE: Folate: 6 ng/mL

## 2014-03-28 LAB — CBC
HCT: 25.1 % — ABNORMAL LOW (ref 36.0–46.0)
Hemoglobin: 8.6 g/dL — ABNORMAL LOW (ref 12.0–15.0)
MCH: 30.4 pg (ref 26.0–34.0)
MCHC: 34.3 g/dL (ref 30.0–36.0)
MCV: 88.7 fL (ref 78.0–100.0)
PLATELETS: 87 10*3/uL — AB (ref 150–400)
RBC: 2.83 MIL/uL — AB (ref 3.87–5.11)
RDW: 16.5 % — ABNORMAL HIGH (ref 11.5–15.5)
WBC: 3.8 10*3/uL — ABNORMAL LOW (ref 4.0–10.5)

## 2014-03-28 LAB — URINE CULTURE
Colony Count: NO GROWTH
Culture: NO GROWTH

## 2014-03-28 LAB — STREP PNEUMONIAE URINARY ANTIGEN: STREP PNEUMO URINARY ANTIGEN: NEGATIVE

## 2014-03-28 LAB — MRSA PCR SCREENING: MRSA by PCR: NEGATIVE

## 2014-03-28 LAB — INFLUENZA PANEL BY PCR (TYPE A & B)
H1N1 flu by pcr: NOT DETECTED
INFLBPCR: NEGATIVE
Influenza A By PCR: NEGATIVE

## 2014-03-28 LAB — GLUCOSE, CAPILLARY: Glucose-Capillary: 72 mg/dL (ref 70–99)

## 2014-03-28 LAB — LEGIONELLA ANTIGEN, URINE: Legionella Antigen, Urine: NEGATIVE

## 2014-03-28 LAB — VITAMIN B12: VITAMIN B 12: 750 pg/mL (ref 211–911)

## 2014-03-28 LAB — FERRITIN: Ferritin: 426 ng/mL — ABNORMAL HIGH (ref 10–291)

## 2014-03-28 NOTE — Progress Notes (Signed)
[facility] PROGRESS NOTE  [name] ZOX:[id number] DOB: Oct 01, 1918 DOA: 03/27/2014 PCP: No primary provider on file.  Assessment/Plan: Sepsis -2ary to CAP, as evidenced by hypothermia, hypoglycemia. -Maintaining BP at present. -Continue IVF and abx as below.  CAP -Continue rocephin/azithro. -Cx data is pending. -Strep pneumo/legionella negative.  Acute on Chronic Encephalopathy -2/2 Infection/sepsis on top of baseline severe dementia. -She is bed bound and non-verbal at baseline.  Failure to Thrive/Severe prot-caloric malnutrition -Life-saving measures would be futile in my opinion. -Patient has been made a DNR. Appreciate CCM and palliative care input. -Will arrange to meet with son soon to determine GOC and disposition.  Hypernatremia -Resolved with IVF hydration.  ARF -2/2 severe volume contraction. -Improving with IVF. -Recheck BMET in am.  Code Status: DNR Family Communication: Son briefly via phone  Disposition Plan: To be determined. Will transfer to floor today.   Consultants:  CCM  Palliative care   Antibiotics:  Rocephin  Azithro   Subjective: Lethargic; unable to arouse  Objective: Filed Vitals:   03/28/14 0400 03/28/14 0600 03/28/14 0800 03/28/14 1000  BP: 148/68 161/71  151/102  Pulse: 72 62  58  Temp:   96 F (35.6 C)   TempSrc:   Axillary   Resp: 20 15  16   Height:      Weight:      SpO2: 100% 99%  99%    Intake/Output Summary (Last 24 hours) at 03/28/14 1449 Last data filed at 03/28/14 0600  Gross per 24 hour  Intake   1800 ml  Output      0 ml  Net   1800 ml   Filed Weights   03/27/14 2152  Weight: 43.6 kg (96 lb 1.9 oz)    Exam:   General:  lethargic  Cardiovascular: RRR  Respiratory: CTA   Abdomen: S/NT/ND/+BS  Extremities: no C/C/E   Neurologic:  lethargic  Data Reviewed: Basic Metabolic Panel:  Recent Labs Lab 03/27/14 1630 03/28/14 0335  NA 150* 147  K 4.6 4.5  CL 115* 116*    CO2 22 20  GLUCOSE 56* 104*  BUN 37* 31*  CREATININE 2.26* 2.11*  CALCIUM 9.6 8.5   Liver Function Tests:  Recent Labs Lab 03/27/14 1630  AST 70*  ALT 24  ALKPHOS 110  BILITOT 0.5  PROT 6.5  ALBUMIN 3.1*   No results found for this basename: LIPASE, AMYLASE,  in the last 168 hours No results found for this basename: AMMONIA,  in the last 168 hours CBC:  Recent Labs Lab 03/27/14 1630 03/28/14 0335  WBC 3.3* 3.8*  NEUTROABS 2.1  --   HGB 9.7* 8.6*  HCT 28.1* 25.1*  MCV 87.8 88.7  PLT 95* 87*   Cardiac Enzymes:  Recent Labs Lab 03/27/14 1630  TROPONINI <0.30   BNP (last 3 results) No results found for this basename: PROBNP,  in the last 8760 hours CBG:  Recent Labs Lab 03/27/14 1628 03/27/14 1822 03/27/14 2010 03/27/14 2323  GLUCAP 50* 108* 86 72    Recent Results (from the past 240 hour(s))  CULTURE, BLOOD (ROUTINE X 2)     Status: None   Collection Time    03/27/14  4:30 PM      Result Value Ref Range Status   Specimen Description BLOOD BLOOD RIGHT FOREARM   Final   Special Requests BOTTLES DRAWN AEROBIC AND ANAEROBIC 5 CC EA   Final   Culture  Setup Time     Final  Value: 03/27/2014 22:26     Performed at [facility]   Culture     Final   Value:        BLOOD CULTURE RECEIVED NO GROWTH TO DATE CULTURE WILL BE HELD FOR 5 DAYS BEFORE ISSUING A FINAL NEGATIVE REPORT     Performed at [facility]   Report Status PENDING   Incomplete  CULTURE, BLOOD (ROUTINE X 2)     Status: None   Collection Time    03/27/14  4:32 PM      Result Value Ref Range Status   Specimen Description BLOOD BLOOD RIGHT FOREARM   Final   Special Requests BOTTLES DRAWN AEROBIC AND ANAEROBIC 5 CC EA   Final   Culture  Setup Time     Final   Value: 03/27/2014 22:24     Performed at [facility]   Culture     Final   Value:        BLOOD CULTURE RECEIVED NO GROWTH TO DATE CULTURE WILL BE HELD FOR 5 DAYS BEFORE ISSUING A FINAL NEGATIVE REPORT      Performed at [facility]   Report Status PENDING   Incomplete  MRSA PCR SCREENING     Status: None   Collection Time    03/27/14 10:32 PM      Result Value Ref Range Status   MRSA by PCR NEGATIVE  NEGATIVE Final   Comment:            The GeneXpert MRSA Assay (FDA     approved for NASAL specimens     only), is one component of a     comprehensive MRSA colonization     surveillance program. It is not     intended to diagnose MRSA     infection nor to guide or     monitor treatment for     MRSA infections.     Studies: Dg Chest 2 View  03/27/2014   CLINICAL DATA:  Altered mental status, evaluate for infiltrate  EXAM: CHEST  2 VIEW  COMPARISON:  DG CHEST 2 VIEW dated 02/24/2013; CT HEAD W/O CM dated 03/27/2014  FINDINGS: Heart size is upper normal. Vascular pattern is normal. There is interstitial infiltrate in the retrocardiac portion of the left lower lobe seen on both the PA and lateral view. The lungs are otherwise clear. There are no pleural effusions.  IMPRESSION: Left lower lobe infiltrate.  This could represent pneumonia.   Electronically Signed   By: Esperanza Heir M.D.   On: 03/27/2014 17:14   Ct Head Wo Contrast  03/27/2014   CLINICAL DATA:  Altered mental status; dementia  EXAM: CT HEAD WITHOUT CONTRAST  TECHNIQUE: Contiguous axial images were obtained from the base of the skull through the vertex without intravenous contrast. Study was obtained within 24 hr of patient's arrival at the emergency department.  COMPARISON:  February 24, 2013  FINDINGS: There is moderate diffuse atrophy. There is no mass, hemorrhage, extra-axial fluid collection, or midline shift. There is patchy small vessel disease in the centra semiovale bilaterally. There is a prior infarct in the periphery of the inferior right cerebellum. There is a lacunar type infarct in the posterior mid left cerebellum.  Bony calvarium appears intact. There is a small enostosis arising from the superior left temporal bone. The  mastoid air cells are clear.  IMPRESSION: Atrophy with small vessel disease and prior small infarcts. No acute appearing infarct present. No hemorrhage or mass  effect.   Electronically Signed   By: Bretta BangWilliam  Woodruff M.D.   On: 03/27/2014 16:50    Scheduled Meds: . azithromycin  500 mg Intravenous Q24H  . cefTRIAXone (ROCEPHIN)  IV  1 g Intravenous Q24H   Continuous Infusions: . sodium chloride 125 mL/hr at 03/27/14 1757  . dextrose 5 % and 0.45% NaCl 100 mL/hr at 03/28/14 0547    Principal Problem:   Acute encephalopathy Active Problems:   CAP (community acquired pneumonia)   Acute renal failure   Hypothermia   Hypoglycemia   Hypernatremia   Anemia   Thrombocytopenia   Dementia   Failure to thrive   Sepsis   Bradycardia    Time spent: 45 minutes. Greater than 50% of this time was spent in direct contact with the patient coordinating care.    Henderson Cloudstela Y Hernandez Acosta  [facility] Pager [phone]  If 7PM-7AM, please contact night-coverage at [email or web address], password [email or web address] 03/28/2014, 2:49 PM  LOS: 1 day

## 2014-03-28 NOTE — Progress Notes (Signed)
INITIAL NUTRITION ASSESSMENT  DOCUMENTATION CODES Per approved criteria  -Severe malnutrition in the context of chronic illness   INTERVENTION: -Palliative consult to see related to plan of care. -Per note family wanted a FULL CODE -If TF desired patient would meet estimated needs with Jevity 1.2 at goal of 40 ml/hr to provided:  1150 kcal, 53 gm protein, 778 ml free water.  NUTRITION DIAGNOSIS: Inadequate oral intake related to mental status as evidenced by no intake per nurse.   Goal: Based on plan of care  Monitor:  Plan of care  Reason for Assessment: MST  80 y.o. female  Admitting Dx: Acute encephalopathy  ASSESSMENT: Patient lives with her son and was admitted with acute encephalopathy, CAP, ARF with hypernatremia, anemia.  Hx of dementia with failure to thrive.  4/10-  Patient did not awaken on my visit.  Nurse reports decreased level of alertness and no oral intake.    Patient meets criteria for severe malnutrition related to chronic illness AEB severe depletion of muscle mass and body fat.    Nutrition Focused Physical Exam:  Subcutaneous Fat:  Orbital Region: severe Upper Arm Region: severe Thoracic and Lumbar Region: severe  Muscle:  Temple Region: severe Clavicle Bone Region: severe Clavicle and Acromion Bone Region: severe Scapular Bone Region: n/a Dorsal Hand: n/a Patellar Region: n/a Anterior Thigh Region: n/a Posterior Calf Region: n/a  Edema: none    Height: Ht Readings from Last 1 Encounters:  03/27/14 5' (1.524 m)    Weight: Wt Readings from Last 1 Encounters:  03/27/14 96 lb 1.9 oz (43.6 kg)    Ideal Body Weight: 100 lbs  % Ideal Body Weight: 96  Wt Readings from Last 10 Encounters:  03/27/14 96 lb 1.9 oz (43.6 kg)    Usual Body Weight: unknown  % Usual Body Weight: unknown  BMI:  Body mass index is 18.77 kg/(m^2).  Estimated Nutritional Needs: Kcal: 1100-1200 Protein: 50 gm Fluid: 1.2L  Skin: intact  Diet Order:   none  EDUCATION NEEDS: -Education not appropriate at this time   Intake/Output Summary (Last 24 hours) at 03/28/14 1125 Last data filed at 03/28/14 0600  Gross per 24 hour  Intake   1800 ml  Output      0 ml  Net   1800 ml    Last BM: unknown  Labs:   Recent Labs Lab 03/27/14 1630 03/28/14 0335  NA 150* 147  K 4.6 4.5  CL 115* 116*  CO2 22 20  BUN 37* 31*  CREATININE 2.26* 2.11*  CALCIUM 9.6 8.5  GLUCOSE 56* 104*    CBG (last 3)   Recent Labs  03/27/14 1822 03/27/14 2010 03/27/14 2323  GLUCAP 108* 86 72    Scheduled Meds: . azithromycin  500 mg Intravenous Q24H  . cefTRIAXone (ROCEPHIN)  IV  1 g Intravenous Q24H    Continuous Infusions: . sodium chloride 125 mL/hr at 03/27/14 1757  . dextrose 5 % and 0.45% NaCl 100 mL/hr at 03/28/14 0547    Past Medical History  Diagnosis Date  . Dementia   . Alzheimer's dementia   . Renal disorder   . Hypertension     History reviewed. No pertinent past surgical history.  [name], RD, LDN Clinical Inpatient Dietitian Pager:  [phone] Weekend and after hours pager:  [phone]

## 2014-03-28 NOTE — Progress Notes (Signed)
Spoke with [name]'s son, [name], via telephone and he is out of town for work today but will be back tomorrow. We did speak briefly about code status and that putting her through CPR, shock, and intubation would cause more harm than good. Robert agreed and said that I could change her to DNR. He told me I could try and call him ~1430 and he may be able to discuss goals of care via telephone and if not he will be here tomorrow. Palliative will continue to follow. Thank you.   Yong ChannelAlicia Jennesis Ramaswamy, NP Palliative Medicine Team Pager # [phone] (M-F 8a-5p) Team Phone # [phone] (Nights/Weekends)

## 2014-03-28 NOTE — Progress Notes (Signed)
Palliative to meet 4/11 11 am for GOC confirmed with son, [name].   Yong ChannelAlicia Donnice Nielsen, NP Palliative Medicine Team Pager # [phone] (M-F 8a-5p) Team Phone # [phone] (Nights/Weekends)

## 2014-03-28 NOTE — Progress Notes (Signed)
SLP Cancellation Note  Patient Details Name: [name] MRN: [id number] DOB: 1918-09-12   Cancelled treatment:        Unable to complete BSE at this time, as pt is too lethargic to participate. RN aware.  We will check with RN this weekend to determine readiness for evaluation.  Kapena Hamme B. Murvin NatalBueche, [facility] CenterMSP, CCC-SLP [phone]  Gray BernhardtCelia B Thierno Hun 03/28/2014, 11:57 AM

## 2014-03-28 NOTE — Progress Notes (Signed)
PT Cancellation Note  Patient Details Name: [name] MRN: [id number] DOB: 1918/09/05   Cancelled Treatment:    Reason Eval/Treat Not Completed: Medical issues which prohibited therapy (for Palliative Care consult. await GOC.) Follow up 03/29/14  Jobe IgoKaren Elizabeth Solomiya Pascale 03/28/2014, 8:15 AM Blanchard KelchKaren Brittani Purdum PT [phone]

## 2014-03-28 NOTE — Consult Note (Signed)
PULMONARY / CRITICAL CARE MEDICINE   Name: [name] MRN: [id number] DOB: September 13, 1918    ADMISSION DATE:  03/27/2014 CONSULTATION DATE:  03/28/2014  REFERRING MD :  Ted Mcalpine  CHIEF COMPLAINT:  Bradycardia  BRIEF PATIENT DESCRIPTION:  80 yo female brought to ER with altered mental status from hypoglycemia, hypernatremia, acute renal failure and PNA.  Pt has hx of advanced dementia and is bed-bound.  She developed bradycardia 4/10 and PCCM consulted to assess appropriateness for aggressive medical care.  SIGNIFICANT EVENTS: 4/09 Admit 4/10 Intermittent bradycardia  STUDIES:  CT head 4/09 >> atrophy with small old infarcts  LINES / TUBES: PIV  CULTURES: Pneumococcal Ag 4/09 >> negative Legionella Ag 4/09 >> negative Influenza PCR 4/09 >> negative  Urine 4/09 >>  Blood 4/09 >>   ANTIBIOTICS: Rocephin 4/09 >>  Zithromax 4/09 >>   HISTORY OF PRESENT ILLNESS:   80 yo female from home with altered mental status.  She has baseline dementia requiring assistance with ADL's.  She was found to have PNA, hypoglycemia, hypernatremia, and acute renal failure.  She is not able to provide history and hx obtained from review of chart.  She was noted to have episodes of bradycardia with sinus pauses on 4/10 >> she was maintaining her blood pressure.  There was concern she could develop bradycardic arrest.  Family is not available at present to discuss goals of care, but primary team is trying to contact them.  PCCM consulted to assess appropriateness of aggressive medical interventions.  PAST MEDICAL HISTORY :  Past Medical History  Diagnosis Date  . Dementia   . Alzheimer's dementia   . Renal disorder   . Hypertension    History reviewed. No pertinent past surgical history. Prior to Admission medications   Not on File   No Known Allergies  FAMILY HISTORY:  History reviewed. No pertinent family history. SOCIAL HISTORY:  reports that she has never smoked. She has never  used smokeless tobacco. She reports that she does not drink alcohol or use illicit drugs.  REVIEW OF SYSTEMS:   Unable to obtain  SUBJECTIVE:   VITAL SIGNS: Temp:  [94.2 F (34.6 C)-98.8 F (37.1 C)] 96 F (35.6 C) (04/10 0800) Pulse Rate:  [56-90] 58 (04/10 1000) Resp:  [14-30] 16 (04/10 1000) BP: (114-182)/(50-102) 151/102 mmHg (04/10 1000) SpO2:  [96 %-100 %] 99 % (04/10 1000) Weight:  [96 lb 1.9 oz (43.6 kg)] 96 lb 1.9 oz (43.6 kg) (04/09 2152) INTAKE / OUTPUT: Intake/Output     04/09 0701 - 04/10 0700 04/10 0701 - 04/11 0700   I.V. (mL/kg) 1800 (41.3)    Total Intake(mL/kg) 1800 (41.3)    Net +1800          Urine Occurrence 1 x      PHYSICAL EXAMINATION: General: Ill appearing Neuro:  Obtunded, not following commands, withdraws to stimuli HEENT:  Dry oral mucosa Cardiovascular:  Bradycardic, regular, no murmur Lungs:  Decreased breath sounds, scattered rhonchi, no wheeze Abdomen:  Soft, decreased bowel sounds, non tender Musculoskeletal:  Decreased muscle mass Skin:  No rashes  LABS:  CBC  Recent Labs Lab 03/27/14 1630 03/28/14 0335  WBC 3.3* 3.8*  HGB 9.7* 8.6*  HCT 28.1* 25.1*  PLT 95* 87*   BMET  Recent Labs Lab 03/27/14 1630 03/28/14 0335  NA 150* 147  K 4.6 4.5  CL 115* 116*  CO2 22 20  BUN 37* 31*  CREATININE 2.26* 2.11*  GLUCOSE 56* 104*   Electrolytes  Recent Labs Lab 03/27/14 1630 03/28/14 0335  CALCIUM 9.6 8.5   Sepsis Markers  Recent Labs Lab 03/27/14 1639  LATICACIDVEN 0.81   Liver Enzymes  Recent Labs Lab 03/27/14 1630  AST 70*  ALT 24  ALKPHOS 110  BILITOT 0.5  ALBUMIN 3.1*   Cardiac Enzymes  Recent Labs Lab 03/27/14 1630  TROPONINI <0.30   Glucose  Recent Labs Lab 03/27/14 1628 03/27/14 1822 03/27/14 2010 03/27/14 2323  GLUCAP 50* 108* 86 72    Imaging Dg Chest 2 View  03/27/2014   CLINICAL DATA:  Altered mental status, evaluate for infiltrate  EXAM: CHEST  2 VIEW  COMPARISON:  DG CHEST 2  VIEW dated 02/24/2013; CT HEAD W/O CM dated 03/27/2014  FINDINGS: Heart size is upper normal. Vascular pattern is normal. There is interstitial infiltrate in the retrocardiac portion of the left lower lobe seen on both the PA and lateral view. The lungs are otherwise clear. There are no pleural effusions.  IMPRESSION: Left lower lobe infiltrate.  This could represent pneumonia.   Electronically Signed   By: Esperanza Heiraymond  Rubner M.D.   On: 03/27/2014 17:14   Ct Head Wo Contrast  03/27/2014   CLINICAL DATA:  Altered mental status; dementia  EXAM: CT HEAD WITHOUT CONTRAST  TECHNIQUE: Contiguous axial images were obtained from the base of the skull through the vertex without intravenous contrast. Study was obtained within 24 hr of patient's arrival at the emergency department.  COMPARISON:  February 24, 2013  FINDINGS: There is moderate diffuse atrophy. There is no mass, hemorrhage, extra-axial fluid collection, or midline shift. There is patchy small vessel disease in the centra semiovale bilaterally. There is a prior infarct in the periphery of the inferior right cerebellum. There is a lacunar type infarct in the posterior mid left cerebellum.  Bony calvarium appears intact. There is a small enostosis arising from the superior left temporal bone. The mastoid air cells are clear.  IMPRESSION: Atrophy with small vessel disease and prior small infarcts. No acute appearing infarct present. No hemorrhage or mass effect.   Electronically Signed   By: Bretta BangWilliam  Woodruff M.D.   On: 03/27/2014 16:50     ASSESSMENT:  80 yo female with acute encephalopathy 2nd to PNA, hypoglycemia, hypernatremia, and acute renal failure.  She has hx of advanced dementia and is bed bound, and needing assistance with ADL's at baseline.  She is noted to have intermittent bradycardia with sinus pauses on 4/10 >> she is maintaining blood pressure.  PLAN:  - Continue IV fluids, and Abx per primary team - Contact family to have further discussions about  goals of care >> in my opinion more aggressive medical interventions (intubation, cardiac resuscitation) would be medically futile, and should not be offered - If her medical condition deteriorates, then would recommend comfort measures  PCCM can be available if needed to assist with family discussions about goals of care.  Otherwise, PCCM will sign off.  Coralyn HellingVineet Baileigh Modisette, MD [facility] Pulmonary/Critical Care 03/28/2014, 1:10 PM Pager:  [phone] After 3pm call: [phone]

## 2014-03-28 NOTE — Progress Notes (Signed)
eLink Physician-Brief Progress Note Patient Name: [name] DOB: [birth year] MRN: [id number]  Date of Service  03/28/2014   HPI/Events of Note   dvt proph  eICU Interventions  SCDs   Intervention Category Intermediate Interventions: Best-practice therapies (e.g. DVT, beta blocker, etc.)  Oretha MilchRakesh V Alva 03/28/2014, 12:06 AM

## 2014-03-29 DIAGNOSIS — Z515 Encounter for palliative care: Secondary | ICD-10-CM

## 2014-03-29 LAB — CBC
HCT: 26.5 % — ABNORMAL LOW (ref 36.0–46.0)
Hemoglobin: 9.1 g/dL — ABNORMAL LOW (ref 12.0–15.0)
MCH: 30.7 pg (ref 26.0–34.0)
MCHC: 34.3 g/dL (ref 30.0–36.0)
MCV: 89.5 fL (ref 78.0–100.0)
Platelets: 83 10*3/uL — ABNORMAL LOW (ref 150–400)
RBC: 2.96 MIL/uL — ABNORMAL LOW (ref 3.87–5.11)
RDW: 17 % — AB (ref 11.5–15.5)
WBC: 2.6 10*3/uL — ABNORMAL LOW (ref 4.0–10.5)

## 2014-03-29 LAB — IRON AND TIBC
Iron: 87 ug/dL (ref 42–135)
Saturation Ratios: 51 % (ref 20–55)
TIBC: 172 ug/dL — ABNORMAL LOW (ref 250–470)
UIBC: 85 ug/dL — ABNORMAL LOW (ref 125–400)

## 2014-03-29 LAB — BASIC METABOLIC PANEL
BUN: 25 mg/dL — AB (ref 6–23)
CO2: 21 mEq/L (ref 19–32)
Calcium: 8.6 mg/dL (ref 8.4–10.5)
Chloride: 114 mEq/L — ABNORMAL HIGH (ref 96–112)
Creatinine, Ser: 2 mg/dL — ABNORMAL HIGH (ref 0.50–1.10)
GFR calc non Af Amer: 20 mL/min — ABNORMAL LOW (ref >90)
GFR, EST AFRICAN AMERICAN: 24 mL/min — AB (ref >90)
Glucose, Bld: 85 mg/dL (ref 70–99)
POTASSIUM: 4 meq/L (ref 3.7–5.3)
SODIUM: 145 meq/L (ref 137–147)

## 2014-03-29 MED ORDER — SCOPOLAMINE 1 MG/3DAYS TD PT72
1.0000 | MEDICATED_PATCH | TRANSDERMAL | Status: DC | PRN
  Filled 2014-03-29: qty 1

## 2014-03-29 MED ORDER — BISACODYL 10 MG RE SUPP: 10.0000 mg | Freq: Every day | RECTAL | Status: DC | PRN

## 2014-03-29 MED ORDER — MORPHINE SULFATE (CONCENTRATE) 10 MG /0.5 ML PO SOLN: 5.0000 mg | ORAL | Status: DC | PRN

## 2014-03-29 MED ORDER — LORAZEPAM 2 MG/ML PO CONC
1.0000 mg | Freq: Two times a day (BID) | ORAL | Status: DC
  Administered 2014-03-29 – 2014-04-01 (×2): 1 mg via ORAL
  Filled 2014-03-29: qty 1

## 2014-03-29 MED ORDER — LORAZEPAM 2 MG/ML PO CONC
1.0000 mg | ORAL | Status: DC | PRN
  Filled 2014-03-29: qty 0.5

## 2014-03-29 NOTE — Progress Notes (Signed)
[facility] PROGRESS NOTE  [name] ZOX:[id number] DOB: 28-Aug-1918 DOA: 03/27/2014 PCP: No primary provider on file.  Assessment/Plan: Sepsis  CAP  Acute on Chronic Encephalopathy  Failure to Thrive/Severe prot-caloric malnutrition  Hypernatremia  ARF   Palliative care met with family today for [place]; plan to proceed with full comfort care. Awaiting residential hospice placement.  Code Status: DNR Family Communication: None today  Disposition Plan: Residential hospice as soon as bed available.   Consultants:  CCM  Palliative care   Antibiotics: None  Subjective: Lethargic; unable to arouse  Objective: Filed Vitals:   03/28/14 1900 03/28/14 2100 03/29/14 0536 03/29/14 1505  BP:  150/63 178/56 142/76  Pulse:  53 61 66  Temp:  93 F (33.9 C)  94.6 F (34.8 C)  TempSrc:  Rectal  Axillary  Resp:  $Remo'16 16 16  'hscmn$ Height:      Weight:      SpO2: 100% 98% 100% 100%    Intake/Output Summary (Last 24 hours) at 03/29/14 1658 Last data filed at 03/29/14 1100  Gross per 24 hour  Intake   2810 ml  Output      0 ml  Net   2810 ml   Filed Weights   03/27/14 2152  Weight: 43.6 kg (96 lb 1.9 oz)    Exam:   General:  lethargic  Cardiovascular: RRR  Respiratory: CTA   Abdomen: S/NT/ND/+BS  Extremities: no C/C/E   Neurologic:  lethargic  Data Reviewed: Basic Metabolic Panel:  Recent Labs Lab 03/27/14 1630 03/28/14 0335 03/29/14 0550  NA 150* 147 145  K 4.6 4.5 4.0  CL 115* 116* 114*  CO2 $Re'22 20 21  'cqL$ GLUCOSE 56* 104* 85  BUN 37* 31* 25*  CREATININE 2.26* 2.11* 2.00*  CALCIUM 9.6 8.5 8.6   Liver Function Tests:  Recent Labs Lab 03/27/14 1630  AST 70*  ALT 24  ALKPHOS 110  BILITOT 0.5  PROT 6.5  ALBUMIN 3.1*   No results found for this basename: LIPASE, AMYLASE,  in the last 168 hours No results found for this basename: AMMONIA,  in the last 168 hours CBC:  Recent Labs Lab 03/27/14 1630 03/28/14 0335 03/29/14 0550    WBC 3.3* 3.8* 2.6*  NEUTROABS 2.1  --   --   HGB 9.7* 8.6* 9.1*  HCT 28.1* 25.1* 26.5*  MCV 87.8 88.7 89.5  PLT 95* 87* 83*   Cardiac Enzymes:  Recent Labs Lab 03/27/14 1630  TROPONINI <0.30   BNP (last 3 results) No results found for this basename: PROBNP,  in the last 8760 hours CBG:  Recent Labs Lab 03/27/14 1628 03/27/14 1822 03/27/14 2010 03/27/14 2323  GLUCAP 50* 108* 86 72    Recent Results (from the past 240 hour(s))  CULTURE, BLOOD (ROUTINE X 2)     Status: None   Collection Time    03/27/14  4:30 PM      Result Value Ref Range Status   Specimen Description BLOOD BLOOD RIGHT FOREARM   Final   Special Requests BOTTLES DRAWN AEROBIC AND ANAEROBIC 5 CC EA   Final   Culture  Setup Time     Final   Value: 03/27/2014 22:26     Performed at [facility]   Culture     Final   Value:        BLOOD CULTURE RECEIVED NO GROWTH TO DATE CULTURE WILL BE HELD FOR 5 DAYS BEFORE ISSUING A FINAL NEGATIVE REPORT  Performed at [facility]   Report Status PENDING   Incomplete  CULTURE, BLOOD (ROUTINE X 2)     Status: None   Collection Time    03/27/14  4:32 PM      Result Value Ref Range Status   Specimen Description BLOOD BLOOD RIGHT FOREARM   Final   Special Requests BOTTLES DRAWN AEROBIC AND ANAEROBIC 5 CC EA   Final   Culture  Setup Time     Final   Value: 03/27/2014 22:24     Performed at [facility]   Culture     Final   Value:        BLOOD CULTURE RECEIVED NO GROWTH TO DATE CULTURE WILL BE HELD FOR 5 DAYS BEFORE ISSUING A FINAL NEGATIVE REPORT     Performed at [facility]   Report Status PENDING   Incomplete  MRSA PCR SCREENING     Status: None   Collection Time    03/27/14 10:32 PM      Result Value Ref Range Status   MRSA by PCR NEGATIVE  NEGATIVE Final   Comment:            The GeneXpert MRSA Assay (FDA     approved for NASAL specimens     only), is one component of a     comprehensive MRSA colonization      surveillance program. It is not     intended to diagnose MRSA     infection nor to guide or     monitor treatment for     MRSA infections.  URINE CULTURE     Status: None   Collection Time    03/27/14 10:40 PM      Result Value Ref Range Status   Specimen Description URINE, CATHETERIZED   Final   Special Requests NONE   Final   Culture  Setup Time     Final   Value: 03/28/2014 01:07     Performed at [facility]     Final   Value: NO GROWTH     Performed at [facility]   Culture     Final   Value: NO GROWTH     Performed at [facility]   Report Status 03/28/2014 FINAL   Final     Studies: Dg Chest 2 View  03/27/2014   CLINICAL DATA:  Altered mental status, evaluate for infiltrate  EXAM: CHEST  2 VIEW  COMPARISON:  DG CHEST 2 VIEW dated 02/24/2013; CT HEAD W/O CM dated 03/27/2014  FINDINGS: Heart size is upper normal. Vascular pattern is normal. There is interstitial infiltrate in the retrocardiac portion of the left lower lobe seen on both the PA and lateral view. The lungs are otherwise clear. There are no pleural effusions.  IMPRESSION: Left lower lobe infiltrate.  This could represent pneumonia.   Electronically Signed   By: Skipper Cliche M.D.   On: 03/27/2014 17:14    Scheduled Meds: . LORazepam  1 mg Oral BID   Continuous Infusions:    Principal Problem:   Acute encephalopathy Active Problems:   CAP (community acquired pneumonia)   Acute renal failure   Hypothermia   Hypoglycemia   Hypernatremia   Anemia   Thrombocytopenia   Dementia   Failure to thrive   Sepsis   Bradycardia    Time spent: 25 minutes. Greater than 50% of this time was spent in direct contact with the patient  coordinating care.    [facility] Pager [phone]  If 7PM-7AM, please contact night-coverage at [email or web address], password [facility] 03/29/2014, 4:58 PM  LOS: 2 days

## 2014-03-29 NOTE — Consult Note (Addendum)
Palliative Medicine  Consult Note  80 yo woman from [place] with progressive decline over the past several weeks admitted with PNA, dehydration and agitation. She was previously living in her own home with her son. At baseline she has required hand feeding and full assist with her ADLs. Her agitation has been so bad that according to her niece she has pulled out all of her hair (used to have think long silver hair).   I met with family to discuss goals of care- her son, 3 grandchildren and her niece. She came from her home, he son lives with her and is her primary caregiver.  They desire full comfort care and dignity-they understand she is likely approaching EOL. 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  Assessment: 1. Advanced Dementia 2. Agitation, has pulled out her IVs 3. PNA, aspiration 4. Dysphagia, refusing to take in oral nutrition 5. Malnutrition  PPS: 10%  Prognosis: <2 weeks  Recommendations:  DNR  Treat agitation with lorazepam scheduled and prn  Roxanol prn for dyspnea  Will need a hospice facility-family specifically request [facility]  Careful hand feeding  She has pulled her IV-will not restart-will not continue IV abx and doubt she can swallow oral abx- family are agreeable.  Foley PRN  Total Time: 50 minutes. Time In: 11:00AM Time Out: 11:50PM Greater than 50%  of this time was spent counseling and coordinating care related to the above assessment and plan.   Lane Hacker, DO Palliative Medicine

## 2014-03-29 NOTE — Progress Notes (Signed)
Referral received for residential Hospice placement.  No family present at bedside.  CSW left a message for Pt's son.  Providence CrosbyAmanda Tytianna Greenley, LCSWA Clinical Social Work [phone]

## 2014-03-30 DIAGNOSIS — Z66 Do not resuscitate: Secondary | ICD-10-CM | POA: Diagnosis not present

## 2014-03-30 NOTE — Progress Notes (Signed)
[facility] PROGRESS NOTE  [name] TGY:[id number] DOB: April 12, 1918 DOA: 03/27/2014 PCP: No primary provider on file.  Assessment/Plan: Sepsis  CAP  Acute on Chronic Encephalopathy  Failure to Thrive/Severe prot-caloric malnutrition  Hypernatremia  ARF   Palliative care met with family 4/11 for [place]; plan to proceed with full comfort care. Awaiting residential hospice placement.  Code Status: DNR Family Communication: None today  Disposition Plan: Residential hospice as soon as bed available.   Consultants:  CCM  Palliative care   Antibiotics: None  Subjective: Lethargic; unable to arouse  Objective: Filed Vitals:   03/29/14 1505 03/29/14 2100 03/30/14 0657 03/30/14 1300  BP: 142/76 182/76 164/89 145/63  Pulse: 66 55 67 69  Temp: 94.6 F (34.8 C) 93.4 F (34.1 C) 94.3 F (34.6 C) 94.3 F (34.6 C)  TempSrc: Axillary Rectal Rectal Axillary  Resp: $Remo'16 18 18 16  'FwGMc$ Height:      Weight:      SpO2: 100% 99% 98% 98%    Intake/Output Summary (Last 24 hours) at 03/30/14 1633 Last data filed at 03/30/14 0700  Gross per 24 hour  Intake     40 ml  Output      0 ml  Net     40 ml   Filed Weights   03/27/14 2152  Weight: 43.6 kg (96 lb 1.9 oz)    Exam:   General:  lethargic  Cardiovascular: RRR  Respiratory: CTA   Abdomen: S/NT/ND/+BS  Extremities: no C/C/E   Neurologic:  lethargic  Data Reviewed: Basic Metabolic Panel:  Recent Labs Lab 03/27/14 1630 03/28/14 0335 03/29/14 0550  NA 150* 147 145  K 4.6 4.5 4.0  CL 115* 116* 114*  CO2 $Re'22 20 21  'ICf$ GLUCOSE 56* 104* 85  BUN 37* 31* 25*  CREATININE 2.26* 2.11* 2.00*  CALCIUM 9.6 8.5 8.6   Liver Function Tests:  Recent Labs Lab 03/27/14 1630  AST 70*  ALT 24  ALKPHOS 110  BILITOT 0.5  PROT 6.5  ALBUMIN 3.1*   No results found for this basename: LIPASE, AMYLASE,  in the last 168 hours No results found for this basename: AMMONIA,  in the last 168 hours CBC:  Recent  Labs Lab 03/27/14 1630 03/28/14 0335 03/29/14 0550  WBC 3.3* 3.8* 2.6*  NEUTROABS 2.1  --   --   HGB 9.7* 8.6* 9.1*  HCT 28.1* 25.1* 26.5*  MCV 87.8 88.7 89.5  PLT 95* 87* 83*   Cardiac Enzymes:  Recent Labs Lab 03/27/14 1630  TROPONINI <0.30   BNP (last 3 results) No results found for this basename: PROBNP,  in the last 8760 hours CBG:  Recent Labs Lab 03/27/14 1628 03/27/14 1822 03/27/14 2010 03/27/14 2323  GLUCAP 50* 108* 86 72    Recent Results (from the past 240 hour(s))  CULTURE, BLOOD (ROUTINE X 2)     Status: None   Collection Time    03/27/14  4:30 PM      Result Value Ref Range Status   Specimen Description BLOOD BLOOD RIGHT FOREARM   Final   Special Requests BOTTLES DRAWN AEROBIC AND ANAEROBIC 5 CC EA   Final   Culture  Setup Time     Final   Value: 03/27/2014 22:26     Performed at [facility]   Culture     Final   Value:        BLOOD CULTURE RECEIVED NO GROWTH TO DATE CULTURE WILL BE HELD FOR 5 DAYS  BEFORE ISSUING A FINAL NEGATIVE REPORT     Performed at [facility]   Report Status PENDING   Incomplete  CULTURE, BLOOD (ROUTINE X 2)     Status: None   Collection Time    03/27/14  4:32 PM      Result Value Ref Range Status   Specimen Description BLOOD BLOOD RIGHT FOREARM   Final   Special Requests BOTTLES DRAWN AEROBIC AND ANAEROBIC 5 CC EA   Final   Culture  Setup Time     Final   Value: 03/27/2014 22:24     Performed at [facility]   Culture     Final   Value:        BLOOD CULTURE RECEIVED NO GROWTH TO DATE CULTURE WILL BE HELD FOR 5 DAYS BEFORE ISSUING A FINAL NEGATIVE REPORT     Performed at [facility]   Report Status PENDING   Incomplete  MRSA PCR SCREENING     Status: None   Collection Time    03/27/14 10:32 PM      Result Value Ref Range Status   MRSA by PCR NEGATIVE  NEGATIVE Final   Comment:            The GeneXpert MRSA Assay (FDA     approved for NASAL specimens     only), is one component of  a     comprehensive MRSA colonization     surveillance program. It is not     intended to diagnose MRSA     infection nor to guide or     monitor treatment for     MRSA infections.  URINE CULTURE     Status: None   Collection Time    03/27/14 10:40 PM      Result Value Ref Range Status   Specimen Description URINE, CATHETERIZED   Final   Special Requests NONE   Final   Culture  Setup Time     Final   Value: 03/28/2014 01:07     Performed at [place]     Final   Value: NO GROWTH     Performed at [facility]   Culture     Final   Value: NO GROWTH     Performed at [facility]   Report Status 03/28/2014 FINAL   Final     Studies: No results found.  Scheduled Meds: . LORazepam  1 mg Oral BID   Continuous Infusions:    Principal Problem:   Acute encephalopathy Active Problems:   CAP (community acquired pneumonia)   Acute renal failure   Hypothermia   Hypoglycemia   Hypernatremia   Anemia   Thrombocytopenia   Dementia   Failure to thrive   Sepsis   Bradycardia   DNR (do not resuscitate)    Time spent: 25 minutes. Greater than 50% of this time was spent in direct contact with the patient coordinating care.    [facility] Pager [phone]  If 7PM-7AM, please contact night-coverage at [email or web address], password [facility] 03/30/2014, 4:33 PM  LOS: 3 days

## 2014-03-30 NOTE — Progress Notes (Signed)
Spoke with Pt's son via phone.  Pt's son stated that he was doing well, considering the situation, and that he is grateful for the hospital's assistance.  Pt's son verbalized that [facility] and he gave CSW permission to make that referral.  Mr. Sophronia SimasBurris [facility].  CSW thanked Mr. Sophronia SimasBurris for his time.    [facility] Forrestine Himva Davis.  Weekday CSW to follow.  Providence CrosbyAmanda Silas Muff, LCSWA Clinical Social Work [phone]

## 2014-03-31 NOTE — Progress Notes (Signed)
[facility] PROGRESS NOTE  [name] LOV:[id number] DOB: 1918/07/23 DOA: 03/27/2014 PCP: No primary provider on file.  Assessment/Plan: Sepsis  CAP  Acute on Chronic Encephalopathy  Failure to Thrive/Severe prot-caloric malnutrition  Hypernatremia  ARF   Palliative care met with family 4/11 for [place]; plan to proceed with full comfort care. Awaiting residential hospice placement.  Code Status: DNR Family Communication: None today  Disposition Plan: Residential hospice as soon as bed available.   Consultants:  CCM  Palliative care   Antibiotics: None  Subjective: Lethargic; unable to arouse  Objective: Filed Vitals:   03/30/14 0657 03/30/14 1300 03/30/14 2218 03/31/14 0447  BP: 164/89 145/63 163/56 164/69  Pulse: 67 69 64 74  Temp: 94.3 F (34.6 C) 94.3 F (34.6 C) 97.3 F (36.3 C) 97.3 F (36.3 C)  TempSrc: Rectal Axillary Axillary Axillary  Resp: $Remo'18 16 16 20  'KWxzm$ Height:      Weight:      SpO2: 98% 98% 100% 100%    Intake/Output Summary (Last 24 hours) at 03/31/14 1343 Last data filed at 03/31/14 1305  Gross per 24 hour  Intake    160 ml  Output    400 ml  Net   -240 ml   Filed Weights   03/27/14 2152  Weight: 43.6 kg (96 lb 1.9 oz)    Exam:   General:  lethargic  Cardiovascular: RRR  Respiratory: CTA   Abdomen: S/NT/ND/+BS  Extremities: no C/C/E   Neurologic:  lethargic  Data Reviewed: Basic Metabolic Panel:  Recent Labs Lab 03/27/14 1630 03/28/14 0335 03/29/14 0550  NA 150* 147 145  K 4.6 4.5 4.0  CL 115* 116* 114*  CO2 $Re'22 20 21  'iud$ GLUCOSE 56* 104* 85  BUN 37* 31* 25*  CREATININE 2.26* 2.11* 2.00*  CALCIUM 9.6 8.5 8.6   Liver Function Tests:  Recent Labs Lab 03/27/14 1630  AST 70*  ALT 24  ALKPHOS 110  BILITOT 0.5  PROT 6.5  ALBUMIN 3.1*   No results found for this basename: LIPASE, AMYLASE,  in the last 168 hours No results found for this basename: AMMONIA,  in the last 168 hours CBC:  Recent  Labs Lab 03/27/14 1630 03/28/14 0335 03/29/14 0550  WBC 3.3* 3.8* 2.6*  NEUTROABS 2.1  --   --   HGB 9.7* 8.6* 9.1*  HCT 28.1* 25.1* 26.5*  MCV 87.8 88.7 89.5  PLT 95* 87* 83*   Cardiac Enzymes:  Recent Labs Lab 03/27/14 1630  TROPONINI <0.30   BNP (last 3 results) No results found for this basename: PROBNP,  in the last 8760 hours CBG:  Recent Labs Lab 03/27/14 1628 03/27/14 1822 03/27/14 2010 03/27/14 2323  GLUCAP 50* 108* 86 72    Recent Results (from the past 240 hour(s))  CULTURE, BLOOD (ROUTINE X 2)     Status: None   Collection Time    03/27/14  4:30 PM      Result Value Ref Range Status   Specimen Description BLOOD BLOOD RIGHT FOREARM   Final   Special Requests BOTTLES DRAWN AEROBIC AND ANAEROBIC 5 CC EA   Final   Culture  Setup Time     Final   Value: 03/27/2014 22:26     Performed at [facility]   Culture     Final   Value:        BLOOD CULTURE RECEIVED NO GROWTH TO DATE CULTURE WILL BE HELD FOR 5 DAYS BEFORE ISSUING A FINAL NEGATIVE  REPORT     Performed at [facility]   Report Status PENDING   Incomplete  CULTURE, BLOOD (ROUTINE X 2)     Status: None   Collection Time    03/27/14  4:32 PM      Result Value Ref Range Status   Specimen Description BLOOD BLOOD RIGHT FOREARM   Final   Special Requests BOTTLES DRAWN AEROBIC AND ANAEROBIC 5 CC EA   Final   Culture  Setup Time     Final   Value: 03/27/2014 22:24     Performed at [facility]   Culture     Final   Value:        BLOOD CULTURE RECEIVED NO GROWTH TO DATE CULTURE WILL BE HELD FOR 5 DAYS BEFORE ISSUING A FINAL NEGATIVE REPORT     Performed at [facility]   Report Status PENDING   Incomplete  MRSA PCR SCREENING     Status: None   Collection Time    03/27/14 10:32 PM      Result Value Ref Range Status   MRSA by PCR NEGATIVE  NEGATIVE Final   Comment:            The GeneXpert MRSA Assay (FDA     approved for NASAL specimens     only), is one component of  a     comprehensive MRSA colonization     surveillance program. It is not     intended to diagnose MRSA     infection nor to guide or     monitor treatment for     MRSA infections.  URINE CULTURE     Status: None   Collection Time    03/27/14 10:40 PM      Result Value Ref Range Status   Specimen Description URINE, CATHETERIZED   Final   Special Requests NONE   Final   Culture  Setup Time     Final   Value: 03/28/2014 01:07     Performed at [facility]     Final   Value: NO GROWTH     Performed at [facility]   Culture     Final   Value: NO GROWTH     Performed at [facility]   Report Status 03/28/2014 FINAL   Final     Studies: No results found.  Scheduled Meds: . LORazepam  1 mg Oral BID   Continuous Infusions:    Principal Problem:   Acute encephalopathy Active Problems:   CAP (community acquired pneumonia)   Acute renal failure   Hypothermia   Hypoglycemia   Hypernatremia   Anemia   Thrombocytopenia   Dementia   Failure to thrive   Sepsis   Bradycardia   DNR (do not resuscitate)    Time spent: 25 minutes. Greater than 50% of this time was spent in direct contact with the patient coordinating care.    [facility] Pager [phone]  If 7PM-7AM, please contact night-coverage at [email or web address], password [facility] 03/31/2014, 1:43 PM  LOS: 4 days

## 2014-03-31 NOTE — Progress Notes (Signed)
CSW called patient's son. Explained no current [facility] availability. [facility] is [facility] choice. CS[facility].  [name] C. Roberth Berling MSW, LCSW [phone]

## 2014-03-31 NOTE — Consult Note (Signed)
[facility] Liaison: Received referral. Chart reviewed. [facility] at [facility]. Will continue to follow, update CSW if availability changes for this patient. Thank you. Forrestine Himva Blaire Hodsdon LCSW [phone]

## 2014-04-01 DIAGNOSIS — J189 Pneumonia, unspecified organism: Secondary | ICD-10-CM

## 2014-04-01 DIAGNOSIS — N179 Acute kidney failure, unspecified: Secondary | ICD-10-CM

## 2014-04-01 DIAGNOSIS — G9340 Encephalopathy, unspecified: Secondary | ICD-10-CM

## 2014-04-01 DIAGNOSIS — I498 Other specified cardiac arrhythmias: Secondary | ICD-10-CM

## 2014-04-01 MED ORDER — SENNOSIDES-DOCUSATE SODIUM 8.6-50 MG PO TABS: 1.0000 | ORAL_TABLET | Freq: Every evening | ORAL | Status: AC | PRN

## 2014-04-01 MED ORDER — LORAZEPAM 2 MG/ML PO CONC: 1.0000 mg | Freq: Two times a day (BID) | ORAL | Status: AC

## 2014-04-01 MED ORDER — BISACODYL 10 MG RE SUPP: 10.0000 mg | Freq: Every day | RECTAL | Status: AC | PRN

## 2014-04-01 MED ORDER — LORAZEPAM 2 MG/ML PO CONC: 1.0000 mg | ORAL | Status: AC | PRN

## 2014-04-01 MED ORDER — MORPHINE SULFATE (CONCENTRATE) 10 MG /0.5 ML PO SOLN: 5.0000 mg | ORAL | Status: DC | PRN

## 2014-04-01 MED ORDER — SCOPOLAMINE 1 MG/3DAYS TD PT72: 1.0000 | MEDICATED_PATCH | TRANSDERMAL | Status: AC | PRN

## 2014-04-01 MED ORDER — SENNOSIDES-DOCUSATE SODIUM 8.6-50 MG PO TABS: 1.0000 | ORAL_TABLET | Freq: Every evening | ORAL | Status: DC | PRN

## 2014-04-01 NOTE — Discharge Summary (Signed)
Physician Discharge Summary  [name] SEG:[id number] DOB: 04/19/1918 DOA: 03/27/2014  PCP: No primary provider on file.  Admit date: 03/27/2014 Discharge date: 04/01/2014  Time spent: 45 minutes  Recommendations for Outpatient Follow-up:  -Will be discharged to Residential Hospice today.   Discharge Diagnoses:  Principal Problem:   Acute encephalopathy Active Problems:   CAP (community acquired pneumonia)   Acute renal failure   Hypothermia   Hypoglycemia   Hypernatremia   Anemia   Thrombocytopenia   Dementia   Failure to thrive   Sepsis   Bradycardia   DNR (do not resuscitate)   Discharge Condition: Stable  Filed Weights   03/27/14 2152  Weight: 43.6 kg (96 lb 1.9 oz)    History of present illness:  Patient is a 80 year old female with history of hypertension, CAD, advanced dementia, failure to thrive, currently living at home with her son, not on any medications was brought to the ER due to altered mental status, decreased by mouth intake in the last 3-4 days. History was obtained from the grand daughter-in-law in the room who stated that at baseline she has advanced dementia, has been worsening, requiring assistance with her ADLs, was able to feed herself. Her son (who has left ) had reported that on Monday, he noticed that she was getting weaker and confused. Today when he came home, she was almost obtunded and not following commands.  The patient has not seen any physician over one year due to financial reasons and is not on any medications.  ER workup showed initial hypothermia of 94.2, BP of 128/50 heart rate in 40-60's. WBCs 3.3, hemoglobin 9.7, hematocrit 28.1 sodium 150, chloride 115, bicarbonate 22, BUN 37, creatinine 2.26, glucose 56  Lactic acid 0.8 chest x-ray showed left lung pneumonia. Hospitalist admission was requested.     Hospital Course:   Sepsis  CAP  Acute on Chronic Encephalopathy  Failure to Thrive/Severe prot-caloric malnutrition    Hypernatremia  ARF   Palliative care met with family 4/11 for [place]; plan to proceed with full comfort care. They decided to DC all non-comfort meds. Awaiting residential hospice placement.      Procedures:  None   Consultations:  Palliative Medicine  Discharge Instructions  Discharge Orders   Future Orders Complete By Expires   Diet - low sodium heart healthy  As directed    Discontinue IV  As directed    Increase activity slowly  As directed        Medication List         bisacodyl 10 MG suppository  Commonly known as:  DULCOLAX  Place 1 suppository (10 mg total) rectally daily as needed for moderate constipation.     LORazepam 2 MG/ML concentrated solution  Commonly known as:  ATIVAN  Take 0.5 mLs (1 mg total) by mouth 2 (two) times daily.     LORazepam 2 MG/ML concentrated solution  Commonly known as:  ATIVAN  Take 0.5 mLs (1 mg total) by mouth every 4 (four) hours as needed for anxiety, seizure, sedation or sleep.     morphine CONCENTRATE 10 mg / 0.5 ml concentrated solution  Take 0.25 mLs (5 mg total) by mouth every 2 (two) hours as needed for moderate pain, severe pain, anxiety or shortness of breath (agitation).     scopolamine 1 MG/3DAYS  Commonly known as:  TRANSDERM-SCOP  Place 1 patch (1.5 mg total) onto the skin every 3 (three) days as needed (upper airway secretions).  senna-docusate 8.6-50 MG per tablet  Commonly known as:  Senokot-S  Take 1 tablet by mouth at bedtime as needed for mild constipation.       No Known Allergies    The results of significant diagnostics from this hospitalization (including imaging, microbiology, ancillary and laboratory) are listed below for reference.    Significant Diagnostic Studies: Dg Chest 2 View  03/27/2014   CLINICAL DATA:  Altered mental status, evaluate for infiltrate  EXAM: CHEST  2 VIEW  COMPARISON:  DG CHEST 2 VIEW dated 02/24/2013; CT HEAD W/O CM dated 03/27/2014  FINDINGS: Heart size is upper  normal. Vascular pattern is normal. There is interstitial infiltrate in the retrocardiac portion of the left lower lobe seen on both the PA and lateral view. The lungs are otherwise clear. There are no pleural effusions.  IMPRESSION: Left lower lobe infiltrate.  This could represent pneumonia.   Electronically Signed   By: Skipper Cliche M.D.   On: 03/27/2014 17:14   Ct Head Wo Contrast  03/27/2014   CLINICAL DATA:  Altered mental status; dementia  EXAM: CT HEAD WITHOUT CONTRAST  TECHNIQUE: Contiguous axial images were obtained from the base of the skull through the vertex without intravenous contrast. Study was obtained within 24 hr of patient's arrival at the emergency department.  COMPARISON:  February 24, 2013  FINDINGS: There is moderate diffuse atrophy. There is no mass, hemorrhage, extra-axial fluid collection, or midline shift. There is patchy small vessel disease in the centra semiovale bilaterally. There is a prior infarct in the periphery of the inferior right cerebellum. There is a lacunar type infarct in the posterior mid left cerebellum.  Bony calvarium appears intact. There is a small enostosis arising from the superior left temporal bone. The mastoid air cells are clear.  IMPRESSION: Atrophy with small vessel disease and prior small infarcts. No acute appearing infarct present. No hemorrhage or mass effect.   Electronically Signed   By: Lowella Grip M.D.   On: 03/27/2014 16:50    Microbiology: Recent Results (from the past 240 hour(s))  CULTURE, BLOOD (ROUTINE X 2)     Status: None   Collection Time    03/27/14  4:30 PM      Result Value Ref Range Status   Specimen Description BLOOD BLOOD RIGHT FOREARM   Final   Special Requests BOTTLES DRAWN AEROBIC AND ANAEROBIC 5 CC EA   Final   Culture  Setup Time     Final   Value: 03/27/2014 22:26     Performed at [facility]   Culture     Final   Value:        BLOOD CULTURE RECEIVED NO GROWTH TO DATE CULTURE WILL BE HELD FOR 5 DAYS  BEFORE ISSUING A FINAL NEGATIVE REPORT     Performed at [facility]   Report Status PENDING   Incomplete  CULTURE, BLOOD (ROUTINE X 2)     Status: None   Collection Time    03/27/14  4:32 PM      Result Value Ref Range Status   Specimen Description BLOOD BLOOD RIGHT FOREARM   Final   Special Requests BOTTLES DRAWN AEROBIC AND ANAEROBIC 5 CC EA   Final   Culture  Setup Time     Final   Value: 03/27/2014 22:24     Performed at [facility]   Culture     Final   Value:        BLOOD CULTURE RECEIVED  NO GROWTH TO DATE CULTURE WILL BE HELD FOR 5 DAYS BEFORE ISSUING A FINAL NEGATIVE REPORT     Performed at [facility]   Report Status PENDING   Incomplete  MRSA PCR SCREENING     Status: None   Collection Time    03/27/14 10:32 PM      Result Value Ref Range Status   MRSA by PCR NEGATIVE  NEGATIVE Final   Comment:            The GeneXpert MRSA Assay (FDA     approved for NASAL specimens     only), is one component of a     comprehensive MRSA colonization     surveillance program. It is not     intended to diagnose MRSA     infection nor to guide or     monitor treatment for     MRSA infections.  URINE CULTURE     Status: None   Collection Time    03/27/14 10:40 PM      Result Value Ref Range Status   Specimen Description URINE, CATHETERIZED   Final   Special Requests NONE   Final   Culture  Setup Time     Final   Value: 03/28/2014 01:07     Performed at [facility]     Final   Value: NO GROWTH     Performed at [facility]   Culture     Final   Value: NO GROWTH     Performed at [facility]   Report Status 03/28/2014 FINAL   Final     Labs: Basic Metabolic Panel:  Recent Labs Lab 03/27/14 1630 03/28/14 0335 03/29/14 0550  NA 150* 147 145  K 4.6 4.5 4.0  CL 115* 116* 114*  CO2 _0 GLUCOSE 56* 104* 85  BUN 37* 31* 25*  CREATININE 2.26* 2.11* 2.00*  CALCIUM 9.6 8.5 8.6   Liver Function  Tests:  Recent Labs Lab 03/27/14 1630  AST 70*  ALT 24  ALKPHOS 110  BILITOT 0.5  PROT 6.5  ALBUMIN 3.1*   No results found for this basename: LIPASE, AMYLASE,  in the last 168 hours No results found for this basename: AMMONIA,  in the last 168 hours CBC:  Recent Labs Lab 03/27/14 1630 03/28/14 0335 03/29/14 0550  WBC 3.3* 3.8* 2.6*  NEUTROABS 2.1  --   --   HGB 9.7* 8.6* 9.1*  HCT 28.1* 25.1* 26.5*  MCV 87.8 88.7 89.5  PLT 95* 87* 83*   Cardiac Enzymes:  Recent Labs Lab 03/27/14 1630  TROPONINI <0.30   BNP: BNP (last 3 results) No results found for this basename: PROBNP,  in the last 8760 hours CBG:  Recent Labs Lab 03/27/14 1628 03/27/14 1822 03/27/14 2010 03/27/14 2323  GLUCAP 50* 108* 86 72       Signed:  Erline Hau  [facility] Pager: [phone] 04/01/2014, 11:25 AM

## 2014-04-01 NOTE — Consult Note (Signed)
[facility] Liaison: Continue to follow for family interest in [facility]. Unfortunately still no availability at this time. Will update CSW if availability changes for this patient. Thank you. Haleigh Desmith LCSW [phone]      

## 2014-04-01 NOTE — Progress Notes (Signed)
Palliative Care Team at [facility] Note   SUBJECTIVE: Resting comfortably. Minimally responsive-per nursing will wake up intermittently. Agitation much improved on current medication regimen.  Interval Events: Palliative Consult 4/11  OBJECTIVE: Vital Signs: BP 159/46  Pulse 60  Temp(Src) 97.4 F (36.3 C) (Axillary)  Resp 16  Ht 5' (1.524 m)  Wt 43.6 kg (96 lb 1.9 oz)  BMI 18.77 kg/m2  SpO2 100%   Intake and Output: 04/13 0701 - 04/14 0700 In: 317 [P.O.:317] Out: 650 [Urine:650]  Physical Exam: General: Vitals noted. Frail, malnourished. Lying in bed appears peaceful  Head: Normocephalic, atraumatic.  Lungs:  Shallow, slow rate, scattered rhonchi  Heart: Tachy  Abdomen:  BS normoactive. Soft, Nondistended, non-tender.  No masses or organomegaly.  Extremities: +edema    No Known Allergies  Medications: Scheduled Meds:  . LORazepam  1 mg Oral BID    Continuous Infusions:    PRN Meds: bisacodyl, LORazepam, morphine CONCENTRATE, scopolamine  Stool Softner: yes  Palliative Performance Scale: 20  %    Labs: CBC    Component Value Date/Time   WBC 2.6* 03/29/2014 0550   RBC 2.96* 03/29/2014 0550   RBC 3.20* 03/27/2014 1630   HGB 9.1* 03/29/2014 0550   HCT 26.5* 03/29/2014 0550   PLT 83* 03/29/2014 0550   MCV 89.5 03/29/2014 0550   MCH 30.7 03/29/2014 0550   MCHC 34.3 03/29/2014 0550   RDW 17.0* 03/29/2014 0550   LYMPHSABS 0.8 03/27/2014 1630   MONOABS 0.3 03/27/2014 1630   EOSABS 0.1 03/27/2014 1630   BASOSABS 0.0 03/27/2014 1630    CMET     Component Value Date/Time   NA 145 03/29/2014 0550   K 4.0 03/29/2014 0550   CL 114* 03/29/2014 0550   CO2 21 03/29/2014 0550   GLUCOSE 85 03/29/2014 0550   BUN 25* 03/29/2014 0550   CREATININE 2.00* 03/29/2014 0550   CALCIUM 8.6 03/29/2014 0550   PROT 6.5 03/27/2014 1630   ALBUMIN 3.1* 03/27/2014 1630   AST 70* 03/27/2014 1630   ALT 24 03/27/2014 1630   ALKPHOS 110 03/27/2014 1630   BILITOT 0.5 03/27/2014 1630   GFRNONAA 20*  03/29/2014 0550   GFRAA 24* 03/29/2014 0550   ASSESSMENT/ PLAN: 80 yo with advanced dementia, failure to thrive, and progressive dysphagia admitted with sepsis PNA. PMT met with family 4/11 and they opted for full comfort care. They currently cannot care for patient at home due to burden of other sick family members and work commitments. They are open to [place]. On initial consultation Ms. Dambrosia was extremely agitated and having distress. I have placed her on scheduled Ativan and also prn ativan and roxanol for comfort. Her PO intake is minimal-she has pulled out her IV access.  1. Continue current medication regimen for agitation-she will need IV or SL administration- unreliable oral intake/aspiration.  2. Roxanol for dyspnea PRN  I recommended a [facility] for her EOL care-she will likely decline further in next few days. Prognosis <2 weeks.   Family have travel limitations and desire [facility] for her EOL care- they are reluctantly open to [facility] but willing to "do what we have to do"-home is not a possibility. I spoke with Phineas Semen over the phone this AM who is hopeful for [facility].     Time In: 9:30 Time Out: 1005 Total Time Spent with Patient: 35 Total Overall Time: 35 min   Greater than 50%  of this time was spent counseling  and coordinating care related to the above assessment and plan.   Acquanetta Chain, DO  04/01/2014, 9:46 AM  Please contact Palliative Medicine Team phone at [phone] for questions and concerns.

## 2014-04-01 NOTE — Progress Notes (Signed)
Report called to unit for transfer to 1324. All questions answered. Message left on son's voicemail to notify of transfer to room 1324. Patient resting. Will prepare for transfer.

## 2014-04-01 NOTE — Progress Notes (Signed)
CSW spoke with Beverlee Nims at [facility]. She has reviewed patient's case and patient is no longer meeting inpatient criteria for residential hospice. CSW met with patient's son and grandson at bedside. Explained that patient is no longer eligible as she was able to eat yesterday and is requiring no symptom management. Discussed options as far as possibly home with hospice and how patient would need 24 hour care. Discused also how patient is not really able to participate in therapy so if they wanted snf as an option they would likely end up having to private pay for room and board.  Denishia Citro C. [place] MSW, [place]

## 2014-04-02 DIAGNOSIS — A419 Sepsis, unspecified organism: Principal | ICD-10-CM

## 2014-04-02 LAB — CULTURE, BLOOD (ROUTINE X 2)
Culture: NO GROWTH
Culture: NO GROWTH

## 2014-04-02 MED ORDER — MORPHINE SULFATE (CONCENTRATE) 10 MG /0.5 ML PO SOLN: 5.0000 mg | ORAL | Status: AC | PRN

## 2014-04-02 NOTE — Progress Notes (Signed)
Received call from son at [phone] that bed had finally arrived from [facility]. Called transport at [phone]. Called Hospice at [phone].

## 2014-04-02 NOTE — Progress Notes (Signed)
Notified by [name] CMRN, patient and family request services of [facility] after discharge; she informed son [name] [phone]) is primary care giver and is expecting a call.  Writer spoke with  [name] via phone to initiate education related to hospice services, philosophy and team approach to care; he voiced good understanding of information provided.  -Per discussion and Epic notes reviewed- plan is to d/c home today; [name] informed he has been living in his mother's house and caring for pt at home prior to this hospital admission; he would like to take pt home by personal vehicle and stated his son can assist with getting pt into the home. -[name] indicated he understood pt has declined significantly and when the time was appropriate he would like [facility]- he is aware the [facility] home care team will assist with this request -[name] indicated pt has not seen a PCP in over a year and is [facility] NP Marletta LorJulie Barr to refer for care after discharge -Pt is currently a DNR and will need a completed GOLD DNR form sent home with pt at d/c.  -   DME  Needs discussed pt currently has a transproot chair at home - he is requesting complete Package D: fully electric hospital bed with AP&P mattress, full rails and over-bed table be delivered as early as possible as he ould need the hospital bed in the home prior to being able to come to the hospital and take [name] home [name] aware of request -address in EPIC is correct; contact person is son [name] at [phone]  [facility]  Please notify [facility] when patient is ready to leave unit at d/c call [phone] (or [phone] if after 5 pm);  [facility] information and contact numbers also given to son [name] during phone discussion.   Above information shared with [name] Please call  with any questions or concerns   [name], RN 04/02/2014, 10:26 AM [facility] RN Liaison [phone]

## 2014-04-02 NOTE — Progress Notes (Signed)
[facility]  [facility] is providing the following services: Hospice Pkg D  If patient discharges after hours, please call [phone].   Renard HamperLecretia Williamson 04/02/2014, 10:29 AM

## 2014-04-02 NOTE — Consult Note (Signed)
[facility] Liaison: Continue to follow for [facility]. Unfortunately still no availability at this time. Will update CSW if availability changes for this patient. Thank you. Forrestine Himva Jerald Villalona LCSW [phone]

## 2014-04-02 NOTE — Progress Notes (Addendum)
Palliative Medicine Team  Received call from [place] regarding patient's current condition- documented much improved PO intake yesterday and only received one dose of her scheduled ativan since initial PMT consultation because she had been comfortable and calm. Today she has refused all PO per nursing- she has not been given scheduled medications until this afternoon for agitation. She is extremely frail and is a very high risk for another acute decline- will need another 24 hours to determine if she reliably improved. In the meantime, I have met with family at their request to discuss other options-- they are concerned and frustrated with situation they are facing regarding her care. They are strongly considering taking her home with Hospice until a bed is available at [facility] or until her condition deteriorates again- they are going to see if they can find some help in the home or cover the need themselves for a short time so that she does not have to go to a SNF- on my assessments she has been mostly unresponsive or agitated and has never followed any commands reliably- SNF for rehab would probably not be in her best interest and within her goals-but there may also be no other choice.  We will continue to follow and assist as needed with her care and symptom management.  Lane Hacker, DO Palliative Medicine

## 2014-04-02 NOTE — Discharge Summary (Signed)
Physician Discharge Summary  [name] AST:[id number] DOB: [birth year] DOA: 03/27/2014  PCP: No primary provider on file.  Admit date: 03/27/2014 Discharge date: 04/02/2014  Time spent: 15 minutes  Recommendations for Outpatient Follow-up:  -Will be discharged to home with hospice services   Discharge Diagnoses:  Principal Problem:   Acute encephalopathy Active Problems:   CAP (community acquired pneumonia)   Acute renal failure   Hypothermia   Hypoglycemia   Hypernatremia   Anemia   Thrombocytopenia   Dementia   Failure to thrive   Sepsis   Bradycardia   DNR (do not resuscitate)   Discharge Condition: Stable  Filed Weights   03/27/14 2152  Weight: 43.6 kg (96 lb 1.9 oz)    History of present illness:  Patient is a 80 year old female with history of hypertension, CAD, advanced dementia, failure to thrive, currently living at home with her son, not on any medications was brought to the ER due to altered mental status, decreased by mouth intake in the last 3-4 days. History was obtained from the grand daughter-in-law in the room who stated that at baseline she has advanced dementia, has been worsening, requiring assistance with her ADLs, was able to feed herself. Her son (who has left ) had reported that on Monday, he noticed that she was getting weaker and confused. Today when he came home, she was almost obtunded and not following commands.  The patient has not seen any physician over one year due to financial reasons and is not on any medications.  ER workup showed initial hypothermia of 94.2, BP of 128/50 heart rate in 40-60's. WBCs 3.3, hemoglobin 9.7, hematocrit 28.1 sodium 150, chloride 115, bicarbonate 22, BUN 37, creatinine 2.26, glucose 56  Lactic acid 0.8 chest x-ray showed left lung pneumonia. Hospitalist admission was requested.     Hospital Course:   Sepsis  CAP  Acute on Chronic Encephalopathy  Failure to Thrive/Severe prot-caloric malnutrition    Hypernatremia  ARF   Palliative care met with family 4/11 for [place]; plan to proceed with full comfort care. They decided to DC all non-comfort meds. There are no beds available at [facility] and other hospice facility did not feel the patient was appropriate. After some discussion with the patient's son, patient was taken home on 4/15 with home hospice.     Procedures:  None   Consultations:  Palliative Medicine  Discharge Instructions      Discharge Orders   Future Orders Complete By Expires   Diet - low sodium heart healthy  As directed    Discontinue IV  As directed    Increase activity slowly  As directed        Medication List         bisacodyl 10 MG suppository  Commonly known as:  DULCOLAX  Place 1 suppository (10 mg total) rectally daily as needed for moderate constipation.     LORazepam 2 MG/ML concentrated solution  Commonly known as:  ATIVAN  Take 0.5 mLs (1 mg total) by mouth 2 (two) times daily.     LORazepam 2 MG/ML concentrated solution  Commonly known as:  ATIVAN  Take 0.5 mLs (1 mg total) by mouth every 4 (four) hours as needed for anxiety, seizure, sedation or sleep.     morphine CONCENTRATE 10 mg / 0.5 ml concentrated solution  Take 0.25 mLs (5 mg total) by mouth every 2 (two) hours as needed for moderate pain, severe pain, anxiety or shortness of breath (  agitation).     scopolamine 1 MG/3DAYS  Commonly known as:  TRANSDERM-SCOP  Place 1 patch (1.5 mg total) onto the skin every 3 (three) days as needed (upper airway secretions).     senna-docusate 8.6-50 MG per tablet  Commonly known as:  Senokot-S  Take 1 tablet by mouth at bedtime as needed for mild constipation.       No Known Allergies    The results of significant diagnostics from this hospitalization (including imaging, microbiology, ancillary and laboratory) are listed below for reference.    Significant Diagnostic Studies: Dg Chest 2 View  03/27/2014   CLINICAL DATA:   Altered mental status, evaluate for infiltrate  EXAM: CHEST  2 VIEW  COMPARISON:  DG CHEST 2 VIEW dated 02/24/2013; CT HEAD W/O CM dated 03/27/2014  FINDINGS: Heart size is upper normal. Vascular pattern is normal. There is interstitial infiltrate in the retrocardiac portion of the left lower lobe seen on both the PA and lateral view. The lungs are otherwise clear. There are no pleural effusions.  IMPRESSION: Left lower lobe infiltrate.  This could represent pneumonia.   Electronically Signed   By: Skipper Cliche M.D.   On: 03/27/2014 17:14   Ct Head Wo Contrast  03/27/2014   CLINICAL DATA:  Altered mental status; dementia  EXAM: CT HEAD WITHOUT CONTRAST  TECHNIQUE: Contiguous axial images were obtained from the base of the skull through the vertex without intravenous contrast. Study was obtained within 24 hr of patient's arrival at the emergency department.  COMPARISON:  February 24, 2013  FINDINGS: There is moderate diffuse atrophy. There is no mass, hemorrhage, extra-axial fluid collection, or midline shift. There is patchy small vessel disease in the centra semiovale bilaterally. There is a prior infarct in the periphery of the inferior right cerebellum. There is a lacunar type infarct in the posterior mid left cerebellum.  Bony calvarium appears intact. There is a small enostosis arising from the superior left temporal bone. The mastoid air cells are clear.  IMPRESSION: Atrophy with small vessel disease and prior small infarcts. No acute appearing infarct present. No hemorrhage or mass effect.   Electronically Signed   By: Lowella Grip M.D.   On: 03/27/2014 16:50    Microbiology: Recent Results (from the past 240 hour(s))  CULTURE, BLOOD (ROUTINE X 2)     Status: None   Collection Time    03/27/14  4:30 PM      Result Value Ref Range Status   Specimen Description BLOOD BLOOD RIGHT FOREARM   Final   Special Requests BOTTLES DRAWN AEROBIC AND ANAEROBIC 5 CC EA   Final   Culture  Setup Time     Final    Value: 03/27/2014 22:26     Performed at [facility]   Culture     Final   Value: NO GROWTH 5 DAYS     Performed at [facility]   Report Status 04/02/2014 FINAL   Final  CULTURE, BLOOD (ROUTINE X 2)     Status: None   Collection Time    03/27/14  4:32 PM      Result Value Ref Range Status   Specimen Description BLOOD BLOOD RIGHT FOREARM   Final   Special Requests BOTTLES DRAWN AEROBIC AND ANAEROBIC 5 CC EA   Final   Culture  Setup Time     Final   Value: 03/27/2014 22:24     Performed at [facility]  Final   Value: NO GROWTH 5 DAYS     Performed at [facility]   Report Status 04/02/2014 FINAL   Final  MRSA PCR SCREENING     Status: None   Collection Time    03/27/14 10:32 PM      Result Value Ref Range Status   MRSA by PCR NEGATIVE  NEGATIVE Final   Comment:            The GeneXpert MRSA Assay (FDA     approved for NASAL specimens     only), is one component of a     comprehensive MRSA colonization     surveillance program. It is not     intended to diagnose MRSA     infection nor to guide or     monitor treatment for     MRSA infections.  URINE CULTURE     Status: None   Collection Time    03/27/14 10:40 PM      Result Value Ref Range Status   Specimen Description URINE, CATHETERIZED   Final   Special Requests NONE   Final   Culture  Setup Time     Final   Value: 03/28/2014 01:07     Performed at [facility]     Final   Value: NO GROWTH     Performed at [facility]   Culture     Final   Value: NO GROWTH     Performed at [facility]   Report Status 03/28/2014 FINAL   Final     Labs: Basic Metabolic Panel:  Recent Labs Lab 03/27/14 1630 03/28/14 0335 03/29/14 0550  NA 150* 147 145  K 4.6 4.5 4.0  CL 115* 116* 114*  CO2 $Re'22 20 21  'pOV$ GLUCOSE 56* 104* 85  BUN 37* 31* 25*  CREATININE 2.26* 2.11* 2.00*  CALCIUM 9.6 8.5 8.6   Liver Function Tests:  Recent Labs Lab  03/27/14 1630  AST 70*  ALT 24  ALKPHOS 110  BILITOT 0.5  PROT 6.5  ALBUMIN 3.1*   No results found for this basename: LIPASE, AMYLASE,  in the last 168 hours No results found for this basename: AMMONIA,  in the last 168 hours CBC:  Recent Labs Lab 03/27/14 1630 03/28/14 0335 03/29/14 0550  WBC 3.3* 3.8* 2.6*  NEUTROABS 2.1  --   --   HGB 9.7* 8.6* 9.1*  HCT 28.1* 25.1* 26.5*  MCV 87.8 88.7 89.5  PLT 95* 87* 83*   Cardiac Enzymes:  Recent Labs Lab 03/27/14 1630  TROPONINI <0.30   BNP: BNP (last 3 results) No results found for this basename: PROBNP,  in the last 8760 hours CBG:  Recent Labs Lab 03/27/14 1628 03/27/14 1822 03/27/14 2010 03/27/14 2323  GLUCAP 50* 108* 86 72       Signed:  [facility] Pager: [phone] 04/02/2014, 2:58 PM

## 2014-04-02 NOTE — Care Management Note (Unsigned)
    Page 1 of 1   04/02/2014     10:13:41 AM   CARE MANAGEMENT NOTE 04/02/2014  Patient:  [name],Dao E   Account Number:  [id number]  Date Initiated:  04/02/2014  Documentation initiated by:  [facility],Jocabed Cheese  Subjective/Objective Assessment:   80 year old female admitted with acute encephalopathy.     Action/Plan:   Home with hospice services at d/c.   Anticipated DC Date:  04/02/2014   Anticipated DC Plan:  HOME W HOSPICE CARE  In-house referral  Clinical Social Worker      DC Planning Services  CM consult      Choice offered to / List presented to:  C-4 Adult Children           HH agency  [facility]   Status of service:  In process, will continue to follow Medicare Important Message given?  NA - LOS <3 / Initial given by admissions (If response is "NO", the following Medicare IM given date fields will be blank) Date Medicare IM given:   Date Additional Medicare IM given:    Discharge Disposition:  HOME W HOSPICE CARE  Per UR Regulation:  Reviewed for med. necessity/level of care/duration of stay  If discussed at Long Length of Stay Meetings, dates discussed:    Comments:  04/02/14 Algernon HuxleyUTH Gedalia Mcmillon RN BSN [phone] Spoke with son Sullivan LoneRobert Burris about home with hospice services. He [facility] to provide te services. [facility] Both her and the son are aware d/c is for today. She will call him to discuss the equipment needed.

## 2014-05-19 DEATH — deceased

## 2014-09-01 IMAGING — CT CT HEAD W/O CM
2 series · 15 of 30 positions shown, 19 images · non-contrast
Comparison: February 24, 2013

CLINICAL DATA: Altered mental status; dementia

EXAM:
CT HEAD WITHOUT CONTRAST
TECHNIQUE: Contiguous axial images were obtained from the base of the skull
through the vertex without intravenous contrast. Study was obtained
within 24 hr of patient's arrival at the emergency department.

[Series 2: head w/o · axial · non-contrast · 0.46mm/px · z∈[-140,-20]mm · 13 of 30 slices shown, 17 images]
[im 3/30  brain]
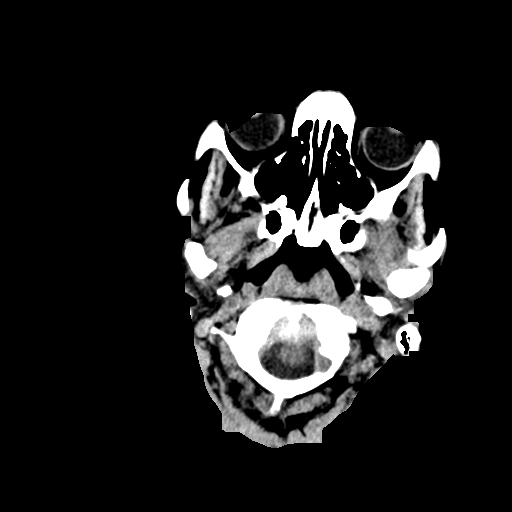
[im 3/30  bone]
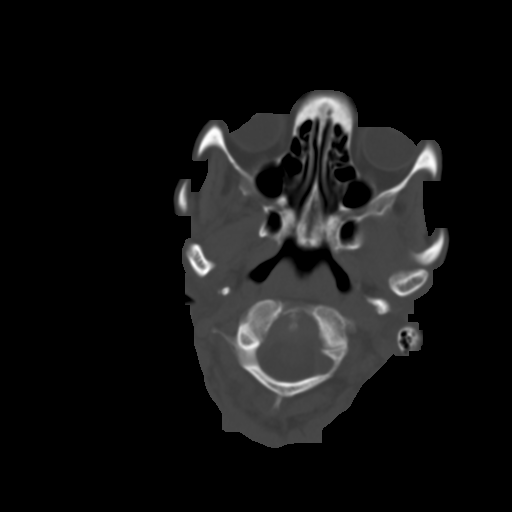
[im 5/30  brain]
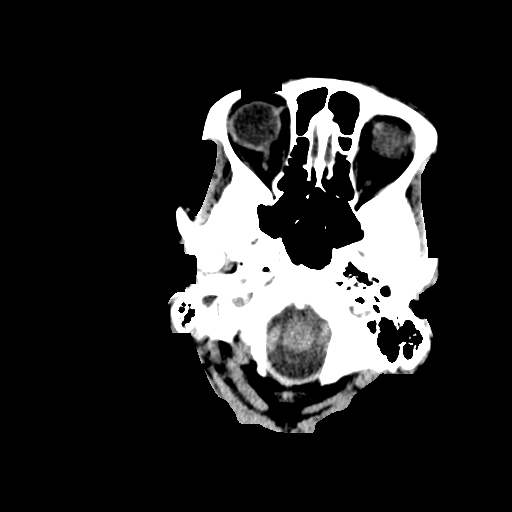
[im 7/30  brain]
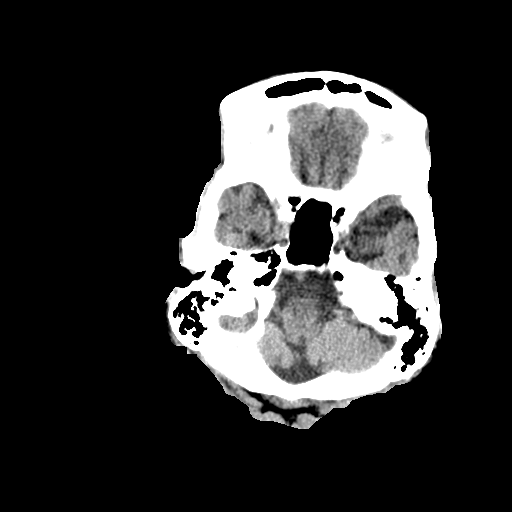
[im 9/30  brain]
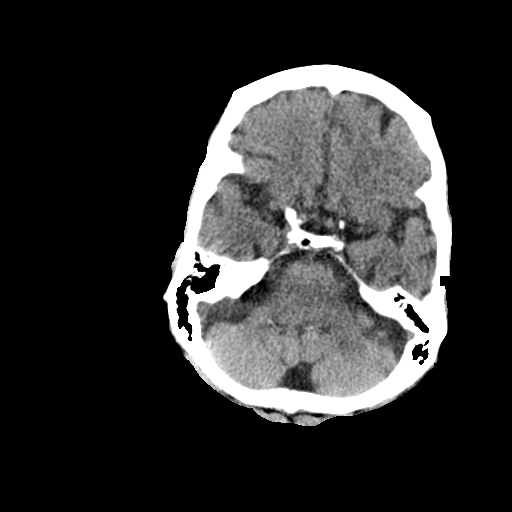
[im 11/30  brain]
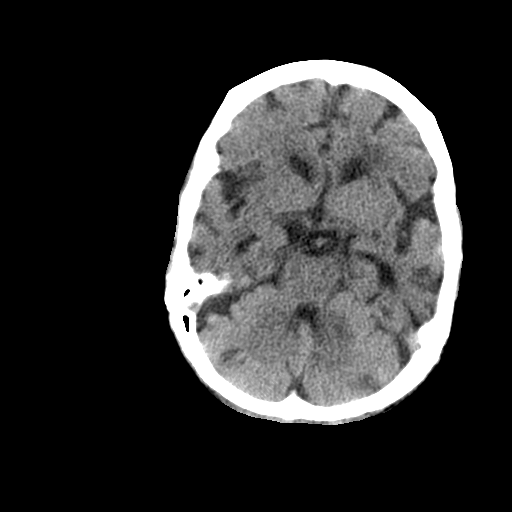
[im 11/30  bone]
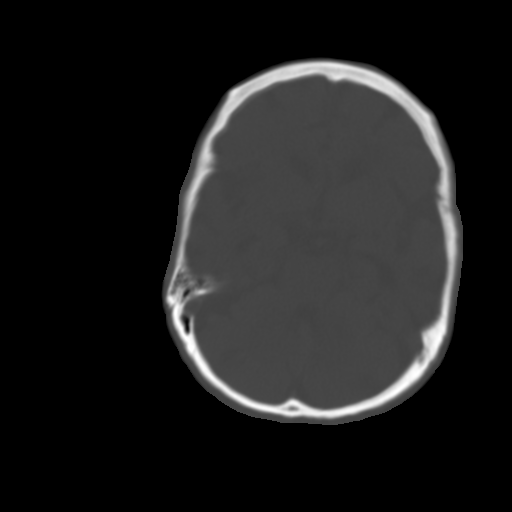
[im 13/30  brain]
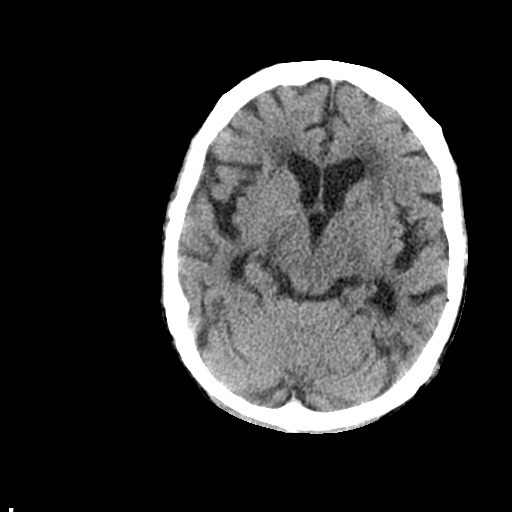
[im 15/30  brain]
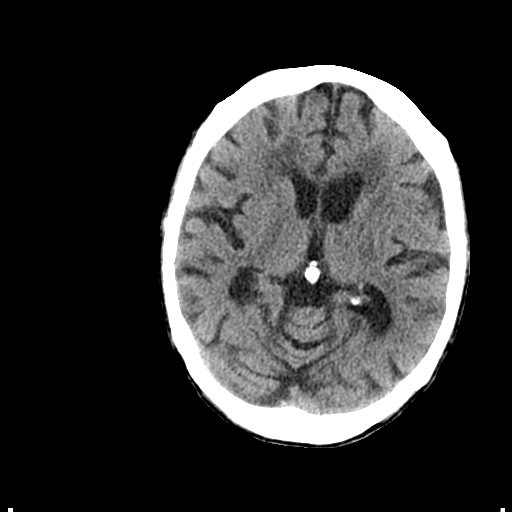
[im 17/30  brain]
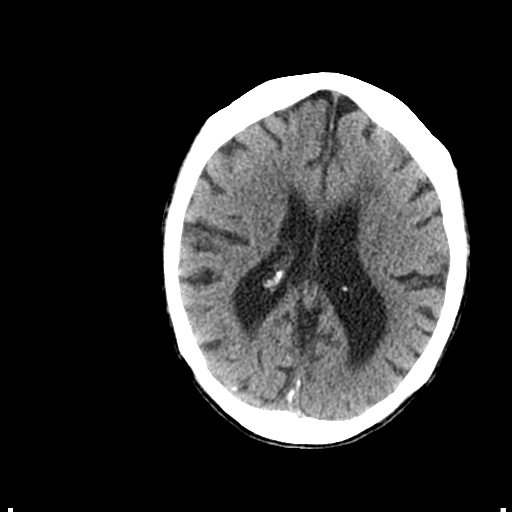
[im 19/30  brain]
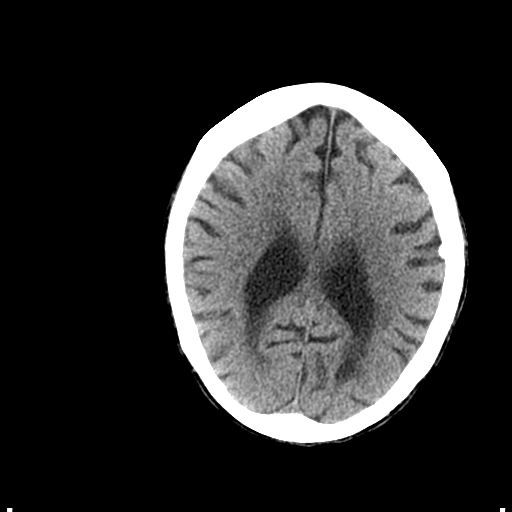
[im 19/30  bone]
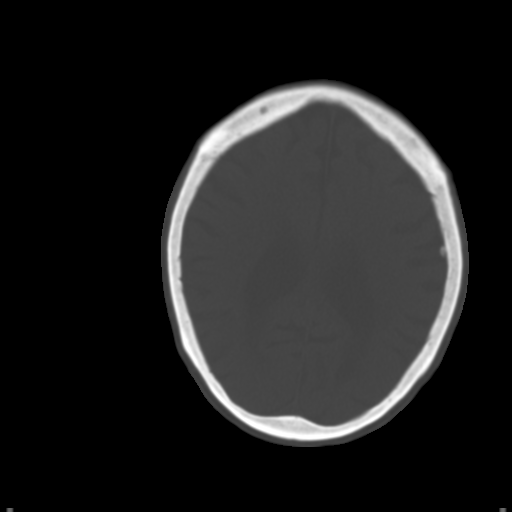
[im 21/30  brain]
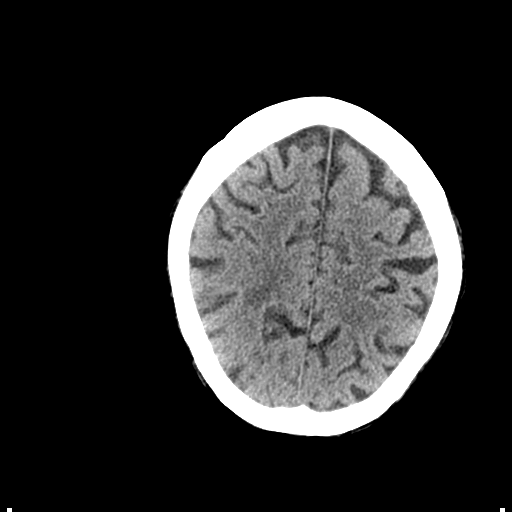
[im 23/30  brain]
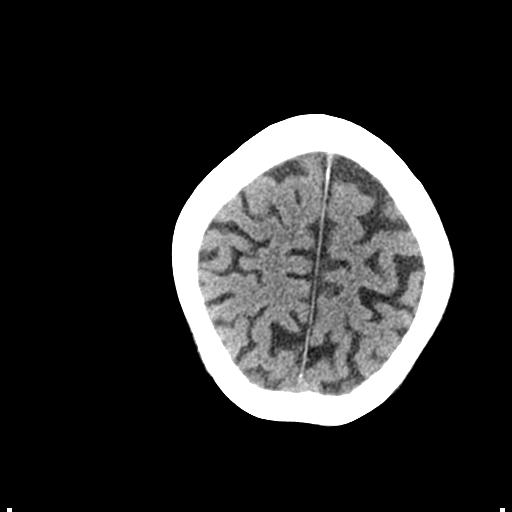
[im 25/30  brain]
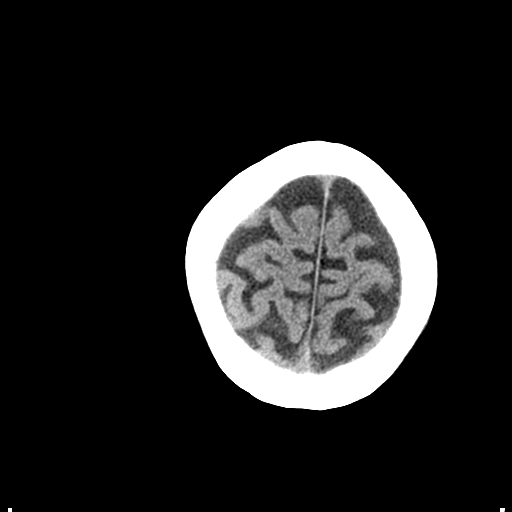
[im 27/30  brain]
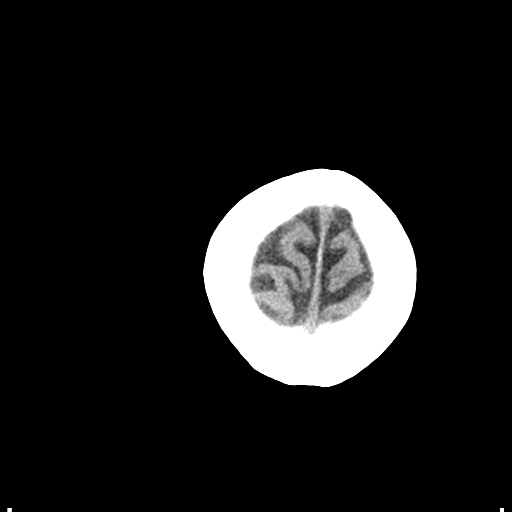
[im 27/30  bone]
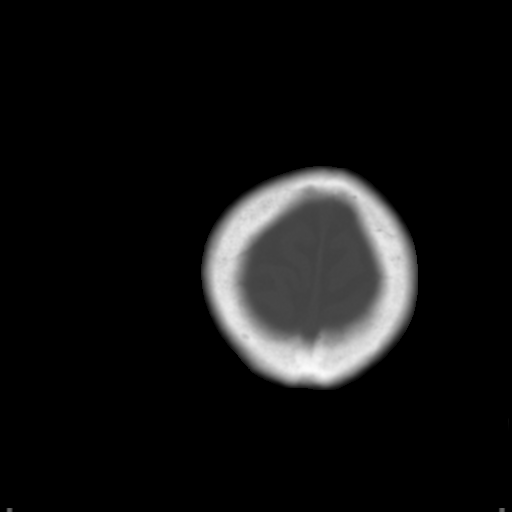

[Series 3: bone windows · axial · 0.46mm/px · z∈[-140,-120]mm · 2 of 30 slices shown]
[im 3/30  bone]
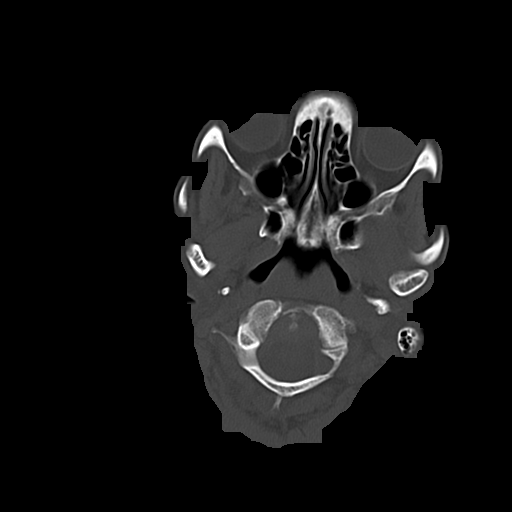
[im 7/30  bone]
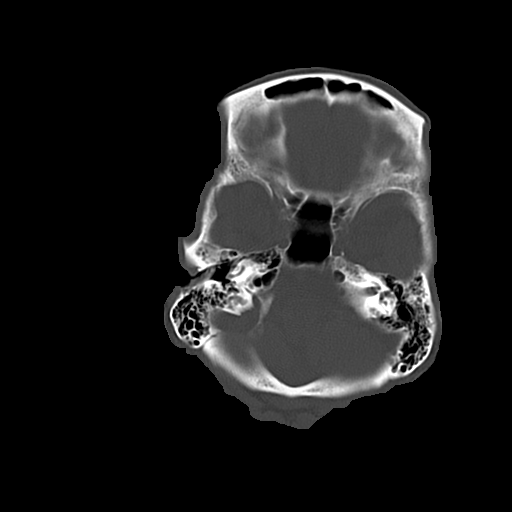

[15 of 30 positions shown; findings below may reference images not displayed]

FINDINGS: There is moderate diffuse atrophy. There is no mass, hemorrhage,
extra-axial fluid collection, or midline shift. There is patchy
small vessel disease in the centra semiovale bilaterally. There is a
prior infarct in the periphery of the inferior right cerebellum.
There is a lacunar type infarct in the posterior mid left
cerebellum.

Bony calvarium appears intact. There is a small enostosis arising
from the superior left temporal bone. The mastoid air cells are
clear.
IMPRESSION: Atrophy with small vessel disease and prior small infarcts. No acute
appearing infarct present. No hemorrhage or mass effect.

## 2014-09-01 IMAGING — CR DG CHEST 2V
2 series · 2 of 2 positions shown · non-contrast
Comparison: DG CHEST 2 VIEW dated 02/24/2013; CT HEAD W/O CM dated
03/27/2014

CLINICAL DATA: Altered mental status, evaluate for infiltrate

EXAM:
CHEST  2 VIEW

[w chest lat]
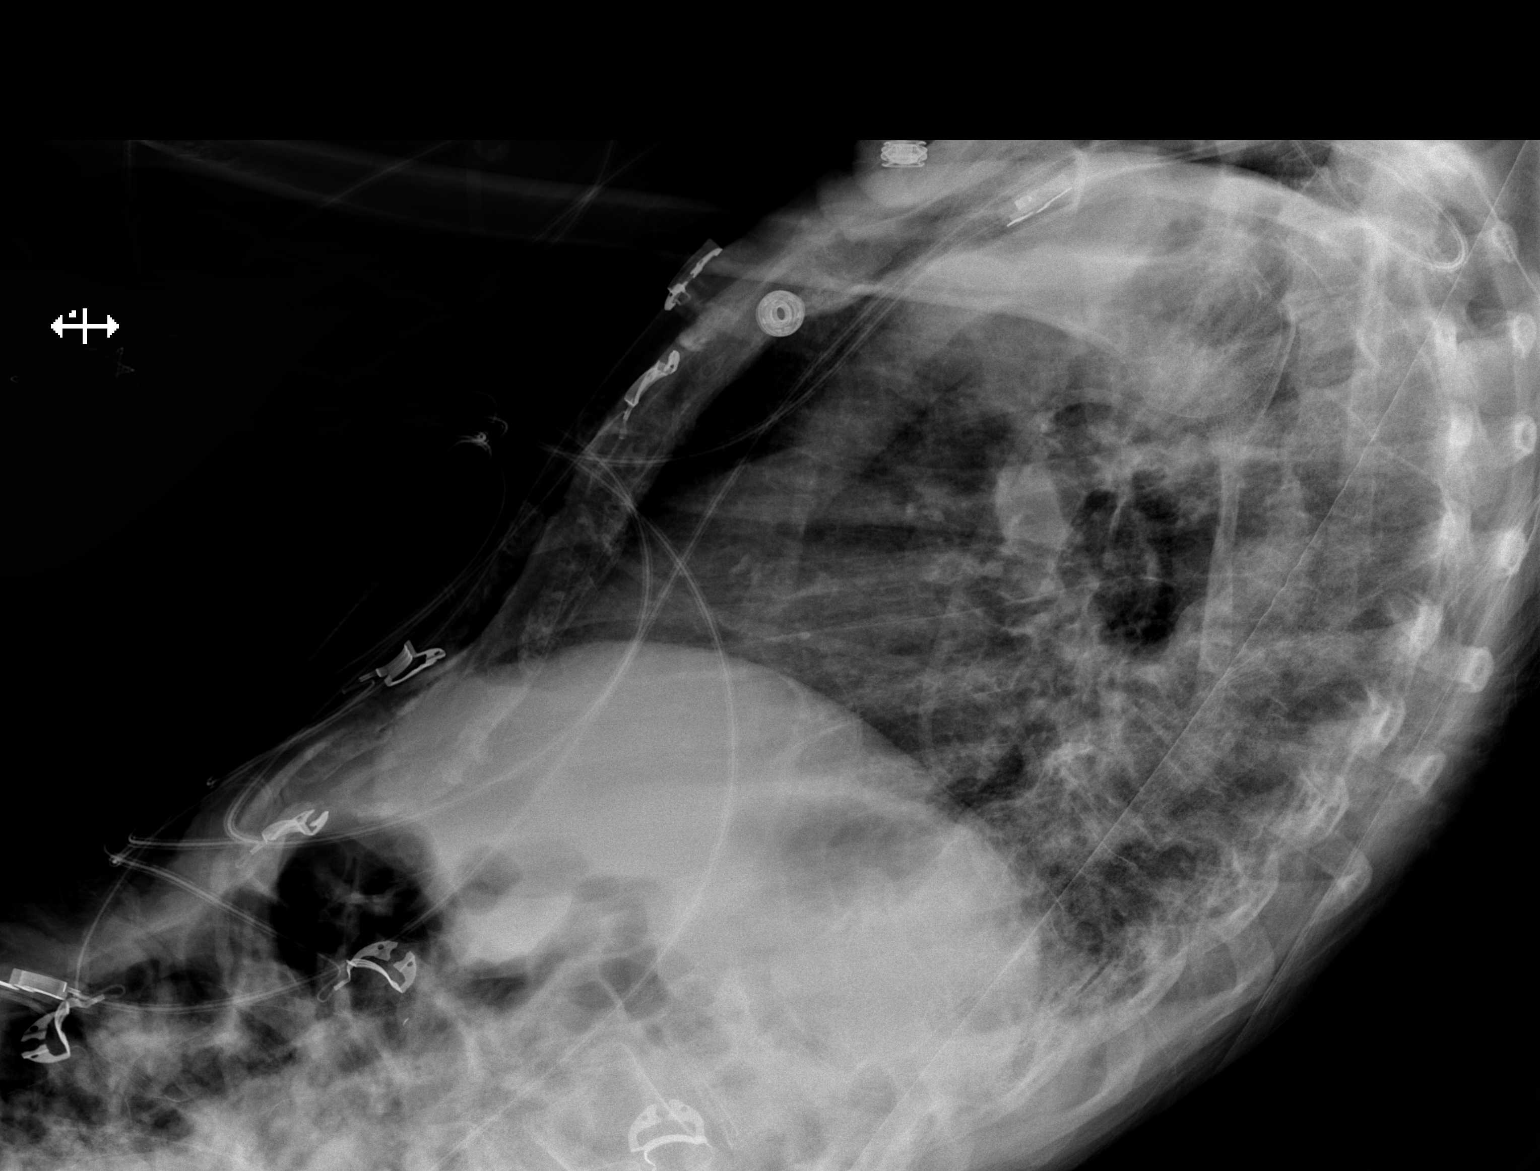

[x chest ap]
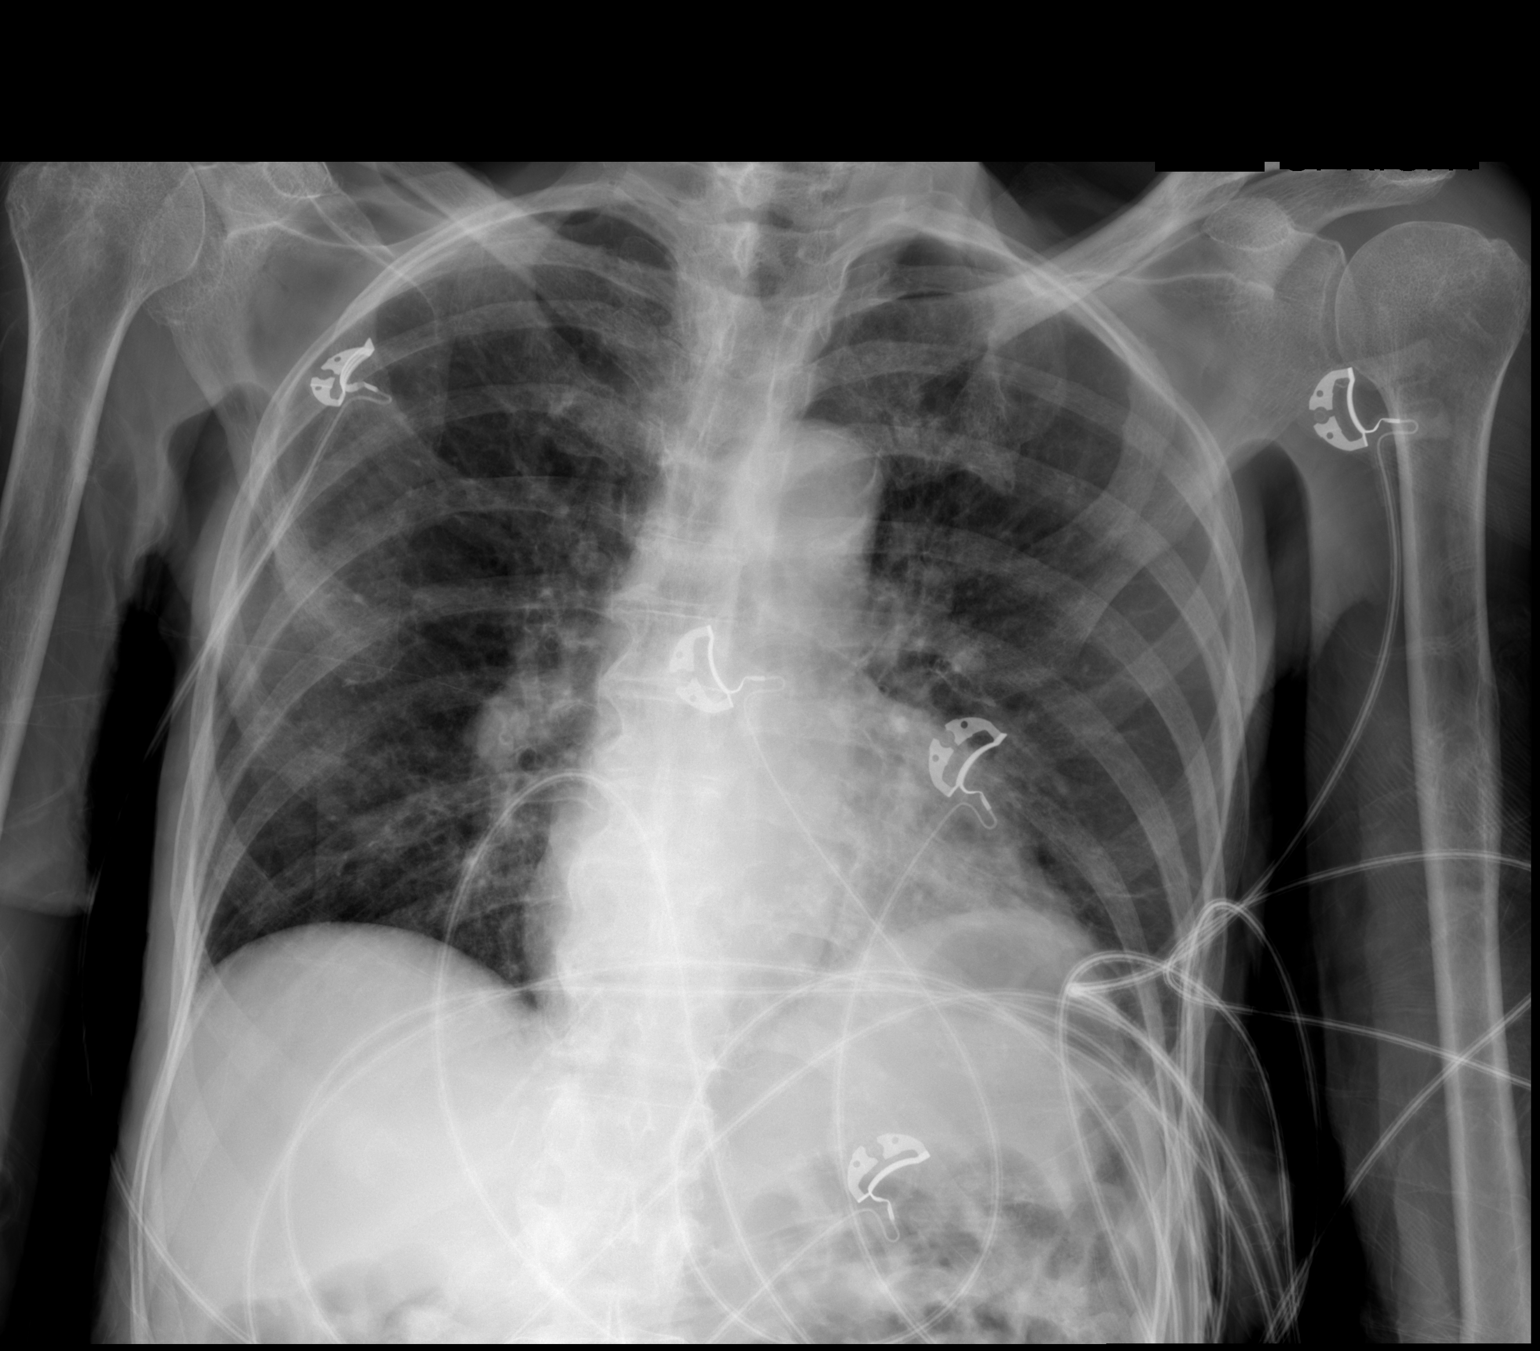

[2 of 2 positions shown; findings below may reference images not displayed]

FINDINGS: Heart size is upper normal. Vascular pattern is normal. There is
interstitial infiltrate in the retrocardiac portion of the left
lower lobe seen on both the PA and lateral view. The lungs are
otherwise clear. There are no pleural effusions.
IMPRESSION: Left lower lobe infiltrate.  This could represent pneumonia.
